# Patient Record
Sex: Female | Born: 1995 | Race: Black or African American | Hispanic: No | Marital: Single | Smoking: Never smoker
Health system: Southern US, Community
[De-identification: ages and names within clinical notes are randomized; demographics above are authoritative.]

## PROBLEM LIST (undated history)

## (undated) HISTORY — PX: WISDOM TOOTH EXTRACTION: SHX21

## (undated) HISTORY — PX: TONSILLECTOMY: SUR1361

---
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
## 2016-04-05 ENCOUNTER — Emergency Department (HOSPITAL_BASED_OUTPATIENT_CLINIC_OR_DEPARTMENT_OTHER): Payer: Self-pay

## 2016-04-05 ENCOUNTER — Emergency Department (HOSPITAL_BASED_OUTPATIENT_CLINIC_OR_DEPARTMENT_OTHER)
Admission: EM | Admit: 2016-04-05 | Discharge: 2016-04-05 | Disposition: A | Discharge status: Home Health | Payer: Self-pay | Attending: Emergency Medicine | Admitting: Emergency Medicine

## 2016-04-05 ENCOUNTER — Encounter (HOSPITAL_BASED_OUTPATIENT_CLINIC_OR_DEPARTMENT_OTHER): Payer: Self-pay | Admitting: Emergency Medicine

## 2016-04-05 DIAGNOSIS — R51 Headache: Secondary | ICD-10-CM | POA: Insufficient documentation

## 2016-04-05 DIAGNOSIS — Z791 Long term (current) use of non-steroidal anti-inflammatories (NSAID): Secondary | ICD-10-CM | POA: Insufficient documentation

## 2016-04-05 DIAGNOSIS — R519 Headache, unspecified: Secondary | ICD-10-CM

## 2016-04-05 DIAGNOSIS — R55 Syncope and collapse: Secondary | ICD-10-CM | POA: Insufficient documentation

## 2016-04-05 LAB — CBC WITH DIFFERENTIAL/PLATELET
Basophils Absolute: 0 10*3/uL (ref 0.0–0.1)
Basophils Relative: 0 %
EOS ABS: 0.5 10*3/uL (ref 0.0–0.7)
EOS PCT: 4 %
HCT: 39.7 % (ref 36.0–46.0)
Hemoglobin: 13.5 g/dL (ref 12.0–15.0)
LYMPHS ABS: 3.6 10*3/uL (ref 0.7–4.0)
Lymphocytes Relative: 29 %
MCH: 30.3 pg (ref 26.0–34.0)
MCHC: 34 g/dL (ref 30.0–36.0)
MCV: 89.2 fL (ref 78.0–100.0)
MONO ABS: 1.1 10*3/uL — AB (ref 0.1–1.0)
Monocytes Relative: 9 %
Neutro Abs: 7.3 10*3/uL (ref 1.7–7.7)
Neutrophils Relative %: 58 %
PLATELETS: 252 10*3/uL (ref 150–400)
RBC: 4.45 MIL/uL (ref 3.87–5.11)
RDW: 12.5 % (ref 11.5–15.5)
WBC: 12.5 10*3/uL — AB (ref 4.0–10.5)

## 2016-04-05 LAB — URINALYSIS, MICROSCOPIC (REFLEX): RBC / HPF: NONE SEEN RBC/hpf (ref 0–5)

## 2016-04-05 LAB — URINALYSIS, ROUTINE W REFLEX MICROSCOPIC
BILIRUBIN URINE: NEGATIVE
GLUCOSE, UA: NEGATIVE mg/dL
HGB URINE DIPSTICK: NEGATIVE
KETONES UR: NEGATIVE mg/dL
Leukocytes, UA: NEGATIVE
Nitrite: NEGATIVE
PROTEIN: 30 mg/dL — AB
Specific Gravity, Urine: 1.012 (ref 1.005–1.030)
pH: 7 (ref 5.0–8.0)

## 2016-04-05 LAB — PREGNANCY, URINE: PREG TEST UR: NEGATIVE

## 2016-04-05 LAB — BASIC METABOLIC PANEL
Anion gap: 10 (ref 5–15)
BUN: 14 mg/dL (ref 6–20)
CALCIUM: 9.5 mg/dL (ref 8.9–10.3)
CO2: 25 mmol/L (ref 22–32)
CREATININE: 0.88 mg/dL (ref 0.44–1.00)
Chloride: 103 mmol/L (ref 101–111)
GFR calc Af Amer: >60 mL/min (ref >60)
GLUCOSE: 94 mg/dL (ref 65–99)
POTASSIUM: 3.1 mmol/L — AB (ref 3.5–5.1)
SODIUM: 138 mmol/L (ref 135–145)

## 2016-04-05 MED ORDER — SODIUM CHLORIDE 0.9 % IV BOLUS (SEPSIS)
1000.0000 mL | Freq: Once | INTRAVENOUS | Status: AC
  Administered 2016-04-05: 1000 mL via INTRAVENOUS

## 2016-04-05 NOTE — ED Triage Notes (Signed)
Patient c/o "migraine" for weeks. Patient c/o dizziness with this. Patient taking ibuprofen 800mg  without relief, last taken "the other day". Patient is ambulatory in triage, NAD noted.

## 2016-04-05 NOTE — ED Notes (Signed)
Patient transported to CT 

## 2016-04-05 NOTE — ED Notes (Signed)
Pt on auto VS and continuous pulse ox as well as cardiac monitoring.

## 2016-04-05 NOTE — ED Provider Notes (Signed)
[facility]   CSN: [id number] Arrival date & time: 04/05/16  1426     History   Chief Complaint Chief Complaint  Patient presents with  . Migraine    with dizziness    HPI Kristie Price is a 20 y.o. female.  Pt presents to the ED today with dizziness and migraine.  She said that she passed out this morning when she got out of the shower.  The pt is due for new glasses and thought the headaches were due to needing new glasses, but when she passed out today, she became worried.      History reviewed. No pertinent past medical history.  There are no active problems to display for this patient.   Past Surgical History:  Procedure Laterality Date  . TONSILLECTOMY    . WISDOM TOOTH EXTRACTION      OB History    No data available       Home Medications    Prior to Admission medications   Medication Sig Start Date End Date Taking? Authorizing Provider  ibuprofen (ADVIL,MOTRIN) 600 MG tablet Take 1 tablet (600 mg total) by mouth every 8 (eight) hours as needed for fever, mild pain or moderate pain. 09/14/15  Yes Trixie DredgeEmily West, PA-C  guaiFENesin-dextromethorphan (ROBITUSSIN DM) 100-10 MG/5ML syrup Take 5 mLs by mouth every 4 (four) hours as needed for cough. 09/14/15   Trixie DredgeEmily West, PA-C  HYDROcodone-acetaminophen (NORCO) 5-325 MG per tablet Take 1-2 tablets by mouth every 6 (six) hours as needed (for pain). 06/26/14   John Molpus, MD  metroNIDAZOLE (FLAGYL) 500 MG tablet Take 500 mg by mouth 3 (three) times daily.    Historical Provider, MD  naproxen (NAPROSYN) 500 MG tablet Take 1 tablet (500 mg total) by mouth 2 (two) times daily. 03/22/15   Tiffany Neva SeatGreene, PA-C  ondansetron (ZOFRAN) 4 MG tablet Take 1 tablet (4 mg total) by mouth every 6 (six) hours. 07/14/15   Eyvonne MechanicJeffrey Hedges, PA-C  pseudoephedrine (SUDAFED) 60 MG tablet Take 1 tablet (60 mg total) by mouth every 6 (six) hours as needed for congestion. 09/14/15   Trixie DredgeEmily West, PA-C    Family  History History reviewed. No pertinent family history.  Social History Social History  Substance Use Topics  . Smoking status: Never Smoker  . Smokeless tobacco: Never Used  . Alcohol use No     Allergies   Patient has no known allergies.   Review of Systems Review of Systems  Neurological: Positive for dizziness and headaches.  All other systems reviewed and are negative.    Physical Exam Updated Vital Signs BP 145/72 (BP Location: Right Arm)   Pulse 106   Temp 97.9 F (36.6 C) (Oral)   Resp 18   Ht 5\' 3"  (1.6 m)   Wt 206 lb 12.8 oz (93.8 kg)   LMP 03/22/2016 (Approximate)   SpO2 100%   BMI 36.63 kg/m   Physical Exam  Constitutional: She is oriented to person, place, and time. She appears well-developed and well-nourished.  HENT:  Head: Normocephalic and atraumatic.  Right Ear: External ear normal.  Left Ear: External ear normal.  Nose: Nose normal.  Mouth/Throat: Oropharynx is clear and moist.  Eyes: Conjunctivae and EOM are normal. Pupils are equal, round, and reactive to light.  Neck: Normal range of motion. Neck supple.  Cardiovascular: Normal rate, regular rhythm, normal heart sounds and intact distal pulses.   Pulmonary/Chest: Effort normal and breath sounds normal.  Abdominal: Soft. Bowel sounds are  normal.  Musculoskeletal: Normal range of motion.  Neurological: She is alert and oriented to person, place, and time.  Skin: Skin is warm.  Psychiatric: She has a normal mood and affect. Her behavior is normal. Judgment and thought content normal.  Nursing note and vitals reviewed.    ED Treatments / Results  Labs (all labs ordered are listed, but only abnormal results are displayed) Labs Reviewed  CBC WITH DIFFERENTIAL/PLATELET - Abnormal; Notable for the following:       Result Value   WBC 12.5 (*)    Monocytes Absolute 1.1 (*)    All other components within normal limits  BASIC METABOLIC PANEL - Abnormal; Notable for the following:     Potassium 3.1 (*)    All other components within normal limits  URINALYSIS, ROUTINE W REFLEX MICROSCOPIC - Abnormal; Notable for the following:    Protein, ur 30 (*)    All other components within normal limits  URINALYSIS, MICROSCOPIC (REFLEX) - Abnormal; Notable for the following:    Bacteria, UA RARE (*)    Squamous Epithelial / LPF 0-5 (*)    All other components within normal limits  PREGNANCY, URINE    EKG  EKG Interpretation  Date/Time:  Sunday April 05 2016 15:21:30 EST Ventricular Rate:  100 PR Interval:    QRS Duration: 88 QT Interval:  327 QTC Calculation: 422 R Axis:   92 Text Interpretation:  Sinus tachycardia Atrial premature complexes Borderline right axis deviation Borderline repolarization abnormality Baseline wander in lead(s) II III aVL aVF Confirmed by Nivea Wojdyla MD, Zadok Holaway ([id number]) on 04/05/2016 3:27:48 PM       Radiology Ct Head Wo Contrast  Result Date: 04/05/2016 CLINICAL DATA:  Migraine headache for the past week. Dizziness after getting out of the shower today. EXAM: CT HEAD WITHOUT CONTRAST TECHNIQUE: Contiguous axial images were obtained from the base of the skull through the vertex without intravenous contrast. COMPARISON:  None. FINDINGS: Brain: No evidence of acute infarction, hemorrhage, hydrocephalus, extra-axial collection or mass lesion/mass effect. Vascular: No hyperdense vessel or unexpected calcification. Skull: Normal. Negative for fracture or focal lesion. Sinuses/Orbits: No acute finding. Other: None. IMPRESSION: Normal examination. Electronically Signed   By: Beckie SaltsSteven  Reid M.D.   On: 04/05/2016 15:27    Procedures Procedures (including critical care time)  Medications Ordered in ED Medications  sodium chloride 0.9 % bolus 1,000 mL (1,000 mLs Intravenous New Bag/Given 04/05/16 1545)     Initial Impression / Assessment and Plan / ED Course  I have reviewed the triage vital signs and the nursing notes.  Pertinent labs & imaging  results that were available during my care of the patient were reviewed by me and considered in my medical decision making (see chart for details).  Clinical Course    Pt is feeling better.  Pt's headaches likely due to visual impairment.  She is encouraged to f/u with her eye doctor.  Pt knows to return if worse.  Final Clinical Impressions(s) / ED Diagnoses   Final diagnoses:  Acute nonintractable headache, unspecified headache type  Syncope, unspecified syncope type    New Prescriptions New Prescriptions   No medications on file     Jacalyn LefevreJulie Lina Hitch, MD 04/05/16 1609

## 2016-05-13 ENCOUNTER — Emergency Department (HOSPITAL_BASED_OUTPATIENT_CLINIC_OR_DEPARTMENT_OTHER)
Admission: EM | Admit: 2016-05-13 | Discharge: 2016-05-13 | Disposition: A | Discharge status: Home / Self Care | Payer: Self-pay | Attending: Emergency Medicine | Admitting: Emergency Medicine

## 2016-05-13 ENCOUNTER — Encounter (HOSPITAL_BASED_OUTPATIENT_CLINIC_OR_DEPARTMENT_OTHER): Payer: Self-pay

## 2016-05-13 DIAGNOSIS — J069 Acute upper respiratory infection, unspecified: Secondary | ICD-10-CM | POA: Insufficient documentation

## 2016-05-13 MED ORDER — ONDANSETRON HCL 4 MG PO TABS: 4.0000 mg | ORAL_TABLET | Freq: Three times a day (TID) | ORAL | 0 refills | Status: DC | PRN

## 2016-05-13 MED ORDER — IPRATROPIUM-ALBUTEROL 0.5-2.5 (3) MG/3ML IN SOLN
3.0000 mL | Freq: Once | RESPIRATORY_TRACT | Status: AC
  Administered 2016-05-13: 3 mL via RESPIRATORY_TRACT
  Filled 2016-05-13: qty 3

## 2016-05-13 MED ORDER — ONDANSETRON HCL 8 MG PO TABS: 4.0000 mg | ORAL_TABLET | Freq: Once | ORAL | Status: DC

## 2016-05-13 MED ORDER — ONDANSETRON 4 MG PO TBDP
ORAL_TABLET | ORAL | Status: AC
  Administered 2016-05-13: 4 mg via ORAL
  Filled 2016-05-13: qty 1

## 2016-05-13 MED ORDER — ONDANSETRON 4 MG PO TBDP
4.0000 mg | ORAL_TABLET | Freq: Once | ORAL | Status: AC
  Administered 2016-05-13: 4 mg via ORAL

## 2016-05-13 NOTE — ED Notes (Signed)
ED Provider at bedside. 

## 2016-05-13 NOTE — ED Triage Notes (Signed)
C/o fever, vomited x 1, head and chest congestion, prod cough-NAD-steady gait

## 2016-05-13 NOTE — ED Provider Notes (Signed)
[facility]   CSN: [id number] Arrival date & time: 05/13/16  1039     History   Chief Complaint Chief Complaint  Patient presents with  . Cough    HPI Kristie Price is a 21 y.o. female otherwise healthy presenting with 1 day of productive coughing from postnasal drip. Reported a subjective fever yesterday she states that she feels dehydrated and weak and vomited water this morning he hasn't had any food today. Mom gave her Robitussin without relief. She is now experiencing generalized body aches today. Denies diarrhea, dysuria, hematuria, abdominal pain, shortness of breath, or chest pain.  HPI  History reviewed. No pertinent past medical history.  There are no active problems to display for this patient.   Past Surgical History:  Procedure Laterality Date  . TONSILLECTOMY    . WISDOM TOOTH EXTRACTION      OB History    No data available       Home Medications    Prior to Admission medications   Medication Sig Start Date End Date Taking? Authorizing Provider  ondansetron (ZOFRAN) 4 MG tablet Take 1 tablet (4 mg total) by mouth every 8 (eight) hours as needed for nausea or vomiting. 05/13/16   Georgiana Shore, PA-C    Family History No family history on file.  Social History Social History  Substance Use Topics  . Smoking status: Never Smoker  . Smokeless tobacco: Never Used  . Alcohol use No     Allergies   Patient has no known allergies.   Review of Systems Review of Systems  Constitutional: Positive for fever. Negative for chills.       Subjective fever at home  HENT: Positive for congestion, postnasal drip and sore throat. Negative for ear pain, sinus pain and sinus pressure.   Eyes: Negative for pain and visual disturbance.  Respiratory: Positive for cough. Negative for choking, chest tightness, shortness of breath, wheezing and stridor.   Cardiovascular: Negative for chest pain and palpitations.  Gastrointestinal:  Positive for nausea and vomiting. Negative for abdominal distention, abdominal pain, blood in stool and diarrhea.  Genitourinary: Negative for dysuria and hematuria.  Musculoskeletal: Positive for myalgias. Negative for arthralgias, back pain, gait problem, neck pain and neck stiffness.  Skin: Negative for color change, pallor and rash.  Neurological: Negative for dizziness, seizures, syncope, weakness and light-headedness.  All other systems reviewed and are negative.    Physical Exam Updated Vital Signs BP 93/67 (BP Location: Left Arm)   Pulse 111   Temp 99.5 F (37.5 C) (Oral)   Resp 18   Ht 5\' 3"  (1.6 m)   Wt 91.2 kg   LMP 05/09/2016   SpO2 100%   BMI 35.61 kg/m   Physical Exam  Constitutional: She appears well-developed and well-nourished. No distress.  Afebrile, nontoxic appearing lying comfortably in bed in no acute distress.  HENT:  Head: Normocephalic and atraumatic.  Mouth/Throat: Oropharynx is clear and moist.  Eyes: Conjunctivae and EOM are normal.  Neck: Normal range of motion. Neck supple.  Cardiovascular: Normal rate, regular rhythm and normal heart sounds.   No murmur heard. Pulmonary/Chest: Effort normal. No respiratory distress. She has wheezes. She has no rales. She exhibits no tenderness.  Wheezing appreciated in left lower lobe  Abdominal: Soft. She exhibits no distension. There is no tenderness. There is no guarding.  Musculoskeletal: She exhibits no edema.  Neurological: She is alert.  Skin: Skin is warm and dry. She is not diaphoretic. No  pallor.  Psychiatric: She has a normal mood and affect.  Nursing note and vitals reviewed.    ED Treatments / Results  Labs (all labs ordered are listed, but only abnormal results are displayed) Labs Reviewed - No data to display  EKG  EKG Interpretation None       Radiology No results found.  Procedures Procedures (including critical care time)  Medications Ordered in ED Medications    ipratropium-albuterol (DUONEB) 0.5-2.5 (3) MG/3ML nebulizer solution 3 mL (3 mLs Nebulization Given by Other 05/13/16 1335)  ondansetron (ZOFRAN-ODT) disintegrating tablet 4 mg (4 mg Oral Given 05/13/16 1340)     Initial Impression / Assessment and Plan / ED Course  I have reviewed the triage vital signs and the nursing notes.  Pertinent labs & imaging results that were available during my care of the patient were reviewed by me and considered in my medical decision making (see chart for details).    Otherwise healthy 21 y/o female presenting with flu-like upper respiratory symptoms for one day. On exam, noted mild left lower lobe wheezing, given duonebs. Patient complained of nausea and was given zofran.  She asked what would be done about her dehydration, Encouraged oral rehydration and patient was tolerating PO well. She had a cup of water and ginger ale while in the Ed. Oral mucous membranes moist and no signs of dehydration noted on exam.   Patient reassessed after duonebs and zofran and her lungs were clear and equal bilaterally and she was drinking and overall felt better.  Discharge home with symptomatic relief of nausea and close PCP follow up.   Discussed strict return precautions. Patient was advised to return to the emergency department if experiencing new or worsening symptoms. she understood instructions and agreed with discharge plan.  Final Clinical Impressions(s) / ED Diagnoses   Final diagnoses:  Acute upper respiratory infection    New Prescriptions Discharge Medication List as of 05/13/2016  3:11 PM    START taking these medications   Details  ondansetron (ZOFRAN) 4 MG tablet Take 1 tablet (4 mg total) by mouth every 8 (eight) hours as needed for nausea or vomiting., Starting Wed 05/13/2016, Print         Georgiana ShoreJessica B Andreka Stucki, PA-C 05/14/16 1840    Jerelyn ScottMartha Linker, MD 05/19/16 1511

## 2016-07-31 ENCOUNTER — Emergency Department (HOSPITAL_BASED_OUTPATIENT_CLINIC_OR_DEPARTMENT_OTHER)
Admission: EM | Admit: 2016-07-31 | Discharge: 2016-07-31 | Disposition: A | Discharge status: Home / Self Care | Payer: Medicaid Other | Attending: Emergency Medicine | Admitting: Emergency Medicine

## 2016-07-31 ENCOUNTER — Encounter (HOSPITAL_BASED_OUTPATIENT_CLINIC_OR_DEPARTMENT_OTHER): Payer: Self-pay | Admitting: Emergency Medicine

## 2016-07-31 DIAGNOSIS — R102 Pelvic and perineal pain: Secondary | ICD-10-CM | POA: Insufficient documentation

## 2016-07-31 DIAGNOSIS — R194 Change in bowel habit: Secondary | ICD-10-CM | POA: Insufficient documentation

## 2016-07-31 DIAGNOSIS — R195 Other fecal abnormalities: Secondary | ICD-10-CM

## 2016-07-31 LAB — URINALYSIS, ROUTINE W REFLEX MICROSCOPIC
BILIRUBIN URINE: NEGATIVE
GLUCOSE, UA: NEGATIVE mg/dL
HGB URINE DIPSTICK: NEGATIVE
KETONES UR: NEGATIVE mg/dL
Leukocytes, UA: NEGATIVE
Nitrite: NEGATIVE
PROTEIN: NEGATIVE mg/dL
Specific Gravity, Urine: 1.013 (ref 1.005–1.030)
pH: 6 (ref 5.0–8.0)

## 2016-07-31 LAB — PREGNANCY, URINE: PREG TEST UR: NEGATIVE

## 2016-07-31 NOTE — ED Provider Notes (Signed)
[facility]   CSN: [id number] Arrival date & time: 07/31/16  1218     History   Chief Complaint Chief Complaint  Patient presents with  . Pelvic Pain    HPI Kristie Price is a 21 y.o. female.  HPI   Last week had period but lasted only 3 days, was heavy bleeding, had heavy cramps as well, midol and ibuprofen works usually but this time didn't.  Had BM today which looked like "pieces of meat", Last week had severe abdominal pain, minor throughout the day but would have episodes.  Now has minor pain.  Rectal pain.  At first had diarrhea but then was straining.  Maybe pink colored.  Can't recall food dye in food. Patient reports she went on google and became concerned that change in stool habits could represent colon cancer.    History reviewed. No pertinent past medical history.  There are no active problems to display for this patient.   Past Surgical History:  Procedure Laterality Date  . TONSILLECTOMY    . WISDOM TOOTH EXTRACTION      OB History    No data available       Home Medications    Prior to Admission medications   Medication Sig Start Date End Date Taking? Authorizing Provider  ondansetron (ZOFRAN) 4 MG tablet Take 1 tablet (4 mg total) by mouth every 8 (eight) hours as needed for nausea or vomiting. 05/13/16   Georgiana Shore, PA-C    Family History History reviewed. No pertinent family history.  Social History Social History  Substance Use Topics  . Smoking status: Never Smoker  . Smokeless tobacco: Never Used  . Alcohol use No     Allergies   Patient has no known allergies.   Review of Systems Review of Systems  Constitutional: Negative for fever.  HENT: Negative for sore throat.   Eyes: Negative for visual disturbance.  Respiratory: Negative for cough and shortness of breath.   Cardiovascular: Negative for chest pain.  Gastrointestinal: Positive for abdominal pain. Negative for diarrhea, nausea and  vomiting.  Genitourinary: Negative for difficulty urinating, vaginal bleeding (last week) and vaginal discharge.  Musculoskeletal: Negative for back pain and neck pain.  Skin: Negative for rash.  Neurological: Negative for syncope and headaches.     Physical Exam Updated Vital Signs BP (!) 145/81 (BP Location: Right Arm)   Pulse (!) 102   Temp 99.1 F (37.3 C) (Oral)   Resp 18   Ht  (1.6 m)   Wt 201 lb (91.2 kg)   LMP 07/28/2016   SpO2 100%   BMI 35.61 kg/m   Physical Exam  Constitutional: She is oriented to person, place, and time. She appears well-developed and well-nourished. No distress.  HENT:  Head: Normocephalic and atraumatic.  Eyes: Conjunctivae and EOM are normal.  Neck: Normal range of motion.  Cardiovascular: Normal rate, regular rhythm, normal heart sounds and intact distal pulses.  Exam reveals no gallop and no friction rub.   No murmur heard. Pulmonary/Chest: Effort normal and breath sounds normal. No respiratory distress. She has no wheezes. She has no rales.  Abdominal: Soft. She exhibits no distension. There is tenderness (reports mild suprapubic discomfort). There is no guarding.  Genitourinary:  Genitourinary Comments: Small skin tag on rectal exam, possible prior hemorrhoid  Musculoskeletal: She exhibits no edema or tenderness.  Neurological: She is alert and oriented to person, place, and time.  Skin: Skin is warm and dry. No  rash noted. She is not diaphoretic. No erythema.  Nursing note and vitals reviewed.    ED Treatments / Results  Labs (all labs ordered are listed, but only abnormal results are displayed) Labs Reviewed  URINALYSIS, ROUTINE W REFLEX MICROSCOPIC  PREGNANCY, URINE    EKG  EKG Interpretation None       Radiology No results found.  Procedures Procedures (including critical care time)  Medications Ordered in ED Medications - No data to display   Initial Impression / Assessment and Plan / ED Course  I have  reviewed the triage vital signs and the nursing notes.  Pertinent labs & imaging results that were available during my care of the patient were reviewed by me and considered in my medical decision making (see chart for details).    21 year old female presents with concern for abnormal period last week, abnormal stool this morning, and abdominal cramping.  Pregnancy test negative. Urinalysis shows no sign of infection. Stool describe a patient does not sound concerning for acute GI bleed or melena. Discussed with patient that abnormal stool. This may be secondary to diet, and recommended outpatient follow-up if she continues to have a change in her stool habits. For her lower abdominal pain, have low suspicion for diverticulitis, appendicitis, given history, exam, no fevers, no focal tenderness. History is not consistent with ovarian torsion. Discussed possibility of doing pelvic exam, however patient declines and reports recent STD testing, no vaginal discharge or pruritus, and have low suspicion for acute pelvic pathology.  Patient is stable for outpatient follow-up. Patient discharged in stable condition with understanding of reasons to return.    Final Clinical Impressions(s) / ED Diagnoses   Final diagnoses:  Pelvic pain in female  Change in consistency of stool    New Prescriptions Discharge Medication List as of 07/31/2016  1:32 PM       Alvira Monday, MD 07/31/16 1635

## 2016-07-31 NOTE — ED Triage Notes (Signed)
Patient states that she has had pain to her mid pelvic region up into her abdominal area. She reports that it is cramping sensation. Denies any N/V/D - reports a change in her stool pattern. The patient reports that her last period was shorter and heavier than usual

## 2016-12-01 ENCOUNTER — Emergency Department (HOSPITAL_BASED_OUTPATIENT_CLINIC_OR_DEPARTMENT_OTHER)
Admission: EM | Admit: 2016-12-01 | Discharge: 2016-12-01 | Disposition: A | Discharge status: Home / Self Care | Payer: Medicaid Other | Attending: Emergency Medicine | Admitting: Emergency Medicine

## 2016-12-01 ENCOUNTER — Encounter (HOSPITAL_BASED_OUTPATIENT_CLINIC_OR_DEPARTMENT_OTHER): Payer: Self-pay | Admitting: *Deleted

## 2016-12-01 DIAGNOSIS — B9689 Other specified bacterial agents as the cause of diseases classified elsewhere: Secondary | ICD-10-CM

## 2016-12-01 DIAGNOSIS — N898 Other specified noninflammatory disorders of vagina: Secondary | ICD-10-CM

## 2016-12-01 DIAGNOSIS — N760 Acute vaginitis: Secondary | ICD-10-CM | POA: Insufficient documentation

## 2016-12-01 LAB — PREGNANCY, URINE: PREG TEST UR: NEGATIVE

## 2016-12-01 LAB — URINALYSIS, ROUTINE W REFLEX MICROSCOPIC
BILIRUBIN URINE: NEGATIVE
Glucose, UA: NEGATIVE mg/dL
HGB URINE DIPSTICK: NEGATIVE
KETONES UR: NEGATIVE mg/dL
NITRITE: NEGATIVE
PROTEIN: NEGATIVE mg/dL
SPECIFIC GRAVITY, URINE: 1.028 (ref 1.005–1.030)
pH: 6 (ref 5.0–8.0)

## 2016-12-01 LAB — WET PREP, GENITAL
SPERM: NONE SEEN
Trich, Wet Prep: NONE SEEN

## 2016-12-01 LAB — URINALYSIS, MICROSCOPIC (REFLEX)

## 2016-12-01 MED ORDER — LIDOCAINE HCL 1 % IJ SOLN
INTRAMUSCULAR | Status: AC
  Filled 2016-12-01: qty 10

## 2016-12-01 MED ORDER — METRONIDAZOLE 500 MG PO TABS: 500.0000 mg | ORAL_TABLET | Freq: Two times a day (BID) | ORAL | 0 refills | Status: DC

## 2016-12-01 MED ORDER — AZITHROMYCIN 250 MG PO TABS
1000.0000 mg | ORAL_TABLET | Freq: Once | ORAL | Status: AC
  Administered 2016-12-01: 1000 mg via ORAL
  Filled 2016-12-01: qty 4

## 2016-12-01 MED ORDER — FLUCONAZOLE 150 MG PO TABS: 150.0000 mg | ORAL_TABLET | Freq: Once | ORAL | 0 refills | Status: AC

## 2016-12-01 MED ORDER — CEFTRIAXONE SODIUM 250 MG IJ SOLR
250.0000 mg | Freq: Once | INTRAMUSCULAR | Status: AC
  Administered 2016-12-01: 250 mg via INTRAMUSCULAR
  Filled 2016-12-01: qty 250

## 2016-12-01 NOTE — ED Triage Notes (Signed)
Pt reports vaginal itching since yesterday, used OTC med for it without relief. She says she is having a "pinkish" discharge and burning with urination.

## 2016-12-01 NOTE — ED Provider Notes (Signed)
[facility]   CSN: [id number] Arrival date & time: 12/01/16  2047     History   Chief Complaint Chief Complaint  Patient presents with  . Vaginal Discharge    HPI Kristie Price is a 21 y.o. female.  Patient is a 21 year old female with no significant past medical history. She presents with complaints of vaginal discharge for the past 2 days. She denies any fevers or chills. She does report sexual contact with one new partner, however he denies any symptoms. Last menstrual period was 2 weeks ago and normal.   The history is provided by the patient.  Vaginal Discharge   This is a new problem. The current episode started 2 days ago. The problem occurs constantly. The problem has been rapidly worsening. The discharge occurs spontaneously. The discharge was brown. Associated symptoms include genital itching. Pertinent negatives include no fever, no nausea, no vomiting and no genital lesions. She has tried nothing for the symptoms.    History reviewed. No pertinent past medical history.  There are no active problems to display for this patient.   Past Surgical History:  Procedure Laterality Date  . TONSILLECTOMY    . WISDOM TOOTH EXTRACTION      OB History    No data available       Home Medications    Prior to Admission medications   Medication Sig Start Date End Date Taking? Authorizing Provider  ondansetron (ZOFRAN) 4 MG tablet Take 1 tablet (4 mg total) by mouth every 8 (eight) hours as needed for nausea or vomiting. 05/13/16   Georgiana Shore, PA-C    Family History No family history on file.  Social History Social History  Substance Use Topics  . Smoking status: Never Smoker  . Smokeless tobacco: Never Used  . Alcohol use No     Allergies   Patient has no known allergies.   Review of Systems Review of Systems  Constitutional: Negative for fever.  Gastrointestinal: Negative for nausea and vomiting.  Genitourinary:  Positive for vaginal discharge.  All other systems reviewed and are negative.    Physical Exam Updated Vital Signs BP (!) 144/85   Pulse 86   Temp 99.7 F (37.6 C) (Oral)   Resp 18   Ht 5\' 3"  (1.6 m)   Wt 93 kg (205 lb)   LMP 11/24/2016   SpO2 99%   BMI 36.31 kg/m   Physical Exam  Constitutional: She is oriented to person, place, and time. She appears well-developed and well-nourished. No distress.  HENT:  Head: Normocephalic and atraumatic.  Neck: Normal range of motion. Neck supple.  Cardiovascular: Normal rate and regular rhythm.  Exam reveals no gallop and no friction rub.   No murmur heard. Pulmonary/Chest: Effort normal and breath sounds normal. No respiratory distress. She has no wheezes.  Abdominal: Soft. Bowel sounds are normal. She exhibits no distension. There is no tenderness.  Genitourinary: Vaginal discharge found.  Genitourinary Comments: There is a brownish discharge present. There is tenderness at the introitus, however no obvious lesions or erythema.  Musculoskeletal: Normal range of motion.  Neurological: She is alert and oriented to person, place, and time.  Skin: Skin is warm and dry. She is not diaphoretic.  Nursing note and vitals reviewed.    ED Treatments / Results  Labs (all labs ordered are listed, but only abnormal results are displayed) Labs Reviewed  URINALYSIS, ROUTINE W REFLEX MICROSCOPIC - Abnormal; Notable for the following:  Result Value   Leukocytes, UA SMALL (*)    All other components within normal limits  URINALYSIS, MICROSCOPIC (REFLEX) - Abnormal; Notable for the following:    Bacteria, UA FEW (*)    Squamous Epithelial / LPF 0-5 (*)    All other components within normal limits  PREGNANCY, URINE    EKG  EKG Interpretation None       Radiology No results found.  Procedures Procedures (including critical care time)  Medications Ordered in ED Medications - No data to display   Initial Impression /  Assessment and Plan / ED Course  I have reviewed the triage vital signs and the nursing notes.  Pertinent labs & imaging results that were available during my care of the patient were reviewed by me and considered in my medical decision making (see chart for details).  Patient presents with complaints of brownish vaginal discharge that has worsened over the past 2 days. She has a new sexual partner, however he is asymptomatic. Her wet prep today reveals many WBCs but also shows clue cells and yeast cells.  GC and Chlamydia tests are pending. She will be given Rocephin, Zithromax, and discharged with Flagyl and Diflucan.  Final Clinical Impressions(s) / ED Diagnoses   Final diagnoses:  None    New Prescriptions New Prescriptions   No medications on file     Geoffery Lyonselo, Chrisanna Mishra, MD 12/01/16 2249

## 2016-12-01 NOTE — Discharge Instructions (Signed)
Flagyl as prescribed.  Diflucan as prescribed upon completion of your Flagyl.  We will call you if your cultures indicate you require further treatment or action.

## 2016-12-03 LAB — GC/CHLAMYDIA PROBE AMP (~~LOC~~) NOT AT ARMC
Chlamydia: NEGATIVE
Neisseria Gonorrhea: NEGATIVE

## 2017-04-28 ENCOUNTER — Emergency Department (HOSPITAL_BASED_OUTPATIENT_CLINIC_OR_DEPARTMENT_OTHER)
Admission: EM | Admit: 2017-04-28 | Discharge: 2017-04-28 | Disposition: A | Discharge status: Home / Self Care | Payer: Self-pay | Attending: Emergency Medicine | Admitting: Emergency Medicine

## 2017-04-28 ENCOUNTER — Other Ambulatory Visit: Payer: Self-pay

## 2017-04-28 ENCOUNTER — Encounter (HOSPITAL_BASED_OUTPATIENT_CLINIC_OR_DEPARTMENT_OTHER): Payer: Self-pay | Admitting: Emergency Medicine

## 2017-04-28 DIAGNOSIS — R197 Diarrhea, unspecified: Secondary | ICD-10-CM | POA: Insufficient documentation

## 2017-04-28 DIAGNOSIS — R112 Nausea with vomiting, unspecified: Secondary | ICD-10-CM | POA: Insufficient documentation

## 2017-04-28 DIAGNOSIS — R102 Pelvic and perineal pain: Secondary | ICD-10-CM | POA: Insufficient documentation

## 2017-04-28 LAB — URINALYSIS, MICROSCOPIC (REFLEX)

## 2017-04-28 LAB — CBC WITH DIFFERENTIAL/PLATELET
BASOS ABS: 0 10*3/uL (ref 0.0–0.1)
BASOS PCT: 0 %
EOS ABS: 0.1 10*3/uL (ref 0.0–0.7)
Eosinophils Relative: 1 %
HCT: 35.5 % — ABNORMAL LOW (ref 36.0–46.0)
HEMOGLOBIN: 12.1 g/dL (ref 12.0–15.0)
Lymphocytes Relative: 12 %
Lymphs Abs: 1.3 10*3/uL (ref 0.7–4.0)
MCH: 30 pg (ref 26.0–34.0)
MCHC: 34.1 g/dL (ref 30.0–36.0)
MCV: 88.1 fL (ref 78.0–100.0)
MONOS PCT: 9 %
Monocytes Absolute: 1 10*3/uL (ref 0.1–1.0)
Neutro Abs: 8.5 10*3/uL — ABNORMAL HIGH (ref 1.7–7.7)
Neutrophils Relative %: 78 %
Platelets: 214 10*3/uL (ref 150–400)
RBC: 4.03 MIL/uL (ref 3.87–5.11)
RDW: 12.4 % (ref 11.5–15.5)
WBC: 11 10*3/uL — ABNORMAL HIGH (ref 4.0–10.5)

## 2017-04-28 LAB — COMPREHENSIVE METABOLIC PANEL
ALBUMIN: 3.7 g/dL (ref 3.5–5.0)
ALK PHOS: 56 U/L (ref 38–126)
ALT: 20 U/L (ref 14–54)
ANION GAP: 6 (ref 5–15)
AST: 20 U/L (ref 15–41)
BILIRUBIN TOTAL: 0.5 mg/dL (ref 0.3–1.2)
BUN: 15 mg/dL (ref 6–20)
CALCIUM: 8.5 mg/dL — AB (ref 8.9–10.3)
CO2: 25 mmol/L (ref 22–32)
Chloride: 106 mmol/L (ref 101–111)
Creatinine, Ser: 0.85 mg/dL (ref 0.44–1.00)
GLUCOSE: 106 mg/dL — AB (ref 65–99)
Potassium: 3.8 mmol/L (ref 3.5–5.1)
Sodium: 137 mmol/L (ref 135–145)
TOTAL PROTEIN: 6.6 g/dL (ref 6.5–8.1)

## 2017-04-28 LAB — URINALYSIS, ROUTINE W REFLEX MICROSCOPIC
BILIRUBIN URINE: NEGATIVE
Glucose, UA: NEGATIVE mg/dL
Ketones, ur: NEGATIVE mg/dL
LEUKOCYTES UA: NEGATIVE
NITRITE: NEGATIVE
Protein, ur: NEGATIVE mg/dL
SPECIFIC GRAVITY, URINE: 1.02 (ref 1.005–1.030)
pH: 6.5 (ref 5.0–8.0)

## 2017-04-28 LAB — PREGNANCY, URINE: PREG TEST UR: NEGATIVE

## 2017-04-28 LAB — LIPASE, BLOOD: LIPASE: 21 U/L (ref 11–51)

## 2017-04-28 MED ORDER — ONDANSETRON HCL 4 MG/2ML IJ SOLN
4.0000 mg | Freq: Once | INTRAMUSCULAR | Status: AC
  Administered 2017-04-28: 4 mg via INTRAVENOUS
  Filled 2017-04-28: qty 2

## 2017-04-28 MED ORDER — LOPERAMIDE HCL 2 MG PO CAPS
4.0000 mg | ORAL_CAPSULE | Freq: Once | ORAL | Status: AC
  Administered 2017-04-28: 4 mg via ORAL
  Filled 2017-04-28: qty 2

## 2017-04-28 MED ORDER — ONDANSETRON HCL 4 MG PO TABS: 4.0000 mg | ORAL_TABLET | Freq: Four times a day (QID) | ORAL | 0 refills | Status: DC | PRN

## 2017-04-28 MED ORDER — SODIUM CHLORIDE 0.9 % IV BOLUS (SEPSIS)
1000.0000 mL | Freq: Once | INTRAVENOUS | Status: AC
  Administered 2017-04-28: 1000 mL via INTRAVENOUS

## 2017-04-28 NOTE — ED Notes (Signed)
Pt discharged to home with family. NAD.  

## 2017-04-28 NOTE — ED Triage Notes (Signed)
Pt presents with c/o abdominal pain n/v/d and feels like her throat is closing all for a few hours

## 2017-04-28 NOTE — Discharge Instructions (Signed)
Take loperamide (Imodium A-D) as needed for diarrhea. ?

## 2017-04-28 NOTE — ED Provider Notes (Addendum)
[facility] EMERGENCY DEPARTMENT Provider Note   CSN: [id number] Arrival date & time: 04/28/17  0136     History   Chief Complaint Chief Complaint  Patient presents with  . Abdominal Pain    HPI Kristie Price is a 22 y.o. female.  The history is provided by the patient.  She had onset 2 hours ago of periumbilical pain, nausea, vomiting, diarrhea.  She has vomited twice and had one episode of diarrhea.  She denies fever but has had chills.  There have been no sweats.  She has no known sick contacts and did not eat anything out of the ordinary.  There is been no treatment at home.  History reviewed. No pertinent past medical history.  There are no active problems to display for this patient.   Past Surgical History:  Procedure Laterality Date  . TONSILLECTOMY    . WISDOM TOOTH EXTRACTION      OB History    No data available       Home Medications    Prior to Admission medications   Medication Sig Start Date End Date Taking? Authorizing Provider  metroNIDAZOLE (FLAGYL) 500 MG tablet Take 1 tablet (500 mg total) by mouth 2 (two) times daily. One po bid x 7 days 12/01/16   Geoffery Lyonselo, Douglas, MD  ondansetron (ZOFRAN) 4 MG tablet Take 1 tablet (4 mg total) by mouth every 8 (eight) hours as needed for nausea or vomiting. 05/13/16   Georgiana ShoreMitchell, Jessica B, PA-C    Family History No family history on file.  Social History Social History   Tobacco Use  . Smoking status: Never Smoker  . Smokeless tobacco: Never Used  Substance Use Topics  . Alcohol use: No  . Drug use: No     Allergies   Patient has no known allergies.   Review of Systems Review of Systems  All other systems reviewed and are negative.    Physical Exam Updated Vital Signs BP 131/78 (BP Location: Left Arm)   Pulse 87   Temp 98.7 F (37.1 C) (Oral)   Resp 18   Ht 5\' 3"  (1.6 m)   Wt 102.1 kg (225 lb)   LMP 04/21/2017   SpO2 97%   BMI 39.86 kg/m   Physical Exam  Nursing note and  vitals reviewed.  22 year old female, resting comfortably and in no acute distress. Vital signs are normal. Oxygen saturation is 97%, which is normal. Head is normocephalic and atraumatic. PERRLA, EOMI. Oropharynx is clear. Neck is nontender and supple without adenopathy or JVD. Back is nontender and there is no CVA tenderness. Lungs are clear without rales, wheezes, or rhonchi. Chest is nontender. Heart has regular rate and rhythm without murmur. Abdomen is soft, flat, with mild periumbilical tenderness.  There is no rebound or guarding.  There are no masses or hepatosplenomegaly and peristalsis is slightly hypoactive. Extremities have no cyanosis or edema, full range mof motion is present. Skin is warm and dry without rash. Neurologic: Mental status is normal, cranial nerves are intact, there are no motor or sensory deficits.  ED Treatments / Results  Labs (all labs ordered are listed, but only abnormal results are displayed) Labs Reviewed  COMPREHENSIVE METABOLIC PANEL - Abnormal; Notable for the following components:      Result Value   Glucose, Bld 106 (*)    Calcium 8.5 (*)    All other components within normal limits  CBC WITH DIFFERENTIAL/PLATELET - Abnormal; Notable for the following components:  WBC 11.0 (*)    HCT 35.5 (*)    Neutro Abs 8.5 (*)    All other components within normal limits  URINALYSIS, ROUTINE W REFLEX MICROSCOPIC - Abnormal; Notable for the following components:   Hgb urine dipstick LARGE (*)    All other components within normal limits  URINALYSIS, MICROSCOPIC (REFLEX) - Abnormal; Notable for the following components:   Bacteria, UA FEW (*)    Squamous Epithelial / LPF 6-30 (*)    All other components within normal limits  LIPASE, BLOOD  PREGNANCY, URINE    Procedures Procedures (including critical care time)  Medications Ordered in ED Medications  sodium chloride 0.9 % bolus 1,000 mL (not administered)  ondansetron (ZOFRAN) injection 4 mg  (not administered)  loperamide (IMODIUM) capsule 4 mg (not administered)     Initial Impression / Assessment and Plan / ED Course  I have reviewed the triage vital signs and the nursing notes.  Pertinent labs & imaging results that were available during my care of the patient were reviewed by me and considered in my medical decision making (see chart for details).  Nausea, vomiting, diarrhea, abdominal pain and pattern most suggestive of viral gastroenteritis, possible food poisoning.  No red flags to suggest more serious illness.  Old records are reviewed, and she does have prior ED visits for abdominal pain, vomiting, diarrhea.  Will check screening labs and give IV fluids, ondansetron, loperamide.  She feels much better after above-noted treatment.  Laboratory workup is unremarkable.  She is discharged with prescription for ondansetron, advised to use over-the-counter loperamide as needed for diarrhea.  Return precautions discussed.  Final Clinical Impressions(s) / ED Diagnoses   Final diagnoses:  Nausea vomiting and diarrhea    ED Discharge Orders        Ordered    ondansetron (ZOFRAN) 4 MG tablet  Every 6 hours PRN     04/28/17 0426       Dione Booze, MD 04/28/17 2956    Dione Booze, MD 04/28/17 [phone]

## 2017-08-01 ENCOUNTER — Encounter (HOSPITAL_BASED_OUTPATIENT_CLINIC_OR_DEPARTMENT_OTHER): Payer: Self-pay | Admitting: *Deleted

## 2017-08-01 ENCOUNTER — Emergency Department (HOSPITAL_BASED_OUTPATIENT_CLINIC_OR_DEPARTMENT_OTHER)
Admission: EM | Admit: 2017-08-01 | Discharge: 2017-08-01 | Disposition: A | Discharge status: Home / Self Care | Payer: Self-pay | Attending: Emergency Medicine | Admitting: Emergency Medicine

## 2017-08-01 ENCOUNTER — Emergency Department (HOSPITAL_BASED_OUTPATIENT_CLINIC_OR_DEPARTMENT_OTHER): Payer: Self-pay

## 2017-08-01 ENCOUNTER — Other Ambulatory Visit: Payer: Self-pay

## 2017-08-01 DIAGNOSIS — R002 Palpitations: Secondary | ICD-10-CM | POA: Insufficient documentation

## 2017-08-01 LAB — PREGNANCY, URINE: PREG TEST UR: NEGATIVE

## 2017-08-01 LAB — CBC
HCT: 35.5 % — ABNORMAL LOW (ref 36.0–46.0)
Hemoglobin: 12.5 g/dL (ref 12.0–15.0)
MCH: 30.9 pg (ref 26.0–34.0)
MCHC: 35.2 g/dL (ref 30.0–36.0)
MCV: 87.7 fL (ref 78.0–100.0)
Platelets: 230 10*3/uL (ref 150–400)
RBC: 4.05 MIL/uL (ref 3.87–5.11)
RDW: 12.5 % (ref 11.5–15.5)
WBC: 11.6 10*3/uL — AB (ref 4.0–10.5)

## 2017-08-01 LAB — BASIC METABOLIC PANEL
Anion gap: 8 (ref 5–15)
BUN: 9 mg/dL (ref 6–20)
CALCIUM: 9.1 mg/dL (ref 8.9–10.3)
CHLORIDE: 107 mmol/L (ref 101–111)
CO2: 23 mmol/L (ref 22–32)
Creatinine, Ser: 0.84 mg/dL (ref 0.44–1.00)
GFR calc non Af Amer: >60 mL/min (ref >60)
Glucose, Bld: 85 mg/dL (ref 65–99)
Potassium: 3.8 mmol/L (ref 3.5–5.1)
SODIUM: 138 mmol/L (ref 135–145)

## 2017-08-01 LAB — D-DIMER, QUANTITATIVE (NOT AT ARMC)

## 2017-08-01 LAB — TROPONIN I: Troponin I: <0.03 ng/mL (ref <0.03)

## 2017-08-01 NOTE — ED Triage Notes (Signed)
PT reports heart fluttering that 'lasted a couple seconds' this morning upon waking up. Denies N/V/SOB. States that this is a recurrent issue over the last several months.

## 2017-08-01 NOTE — ED Provider Notes (Signed)
[facility] EMERGENCY DEPARTMENT Provider Note   CSN: [id number] Arrival date & time: 08/01/17  1452     History   Chief Complaint Chief Complaint  Patient presents with  . Tachycardia    HPI Kristie Price is a 22 y.o. female who presents for evaluation of palpitations that began this morning.  Patient reports that approximately 7 AM, she had palpitations that lasted for approximately 1 minute.  Patient reports she had some a source associated shortness of breath with this.  Patient reports that last night, she had one episode where she had pain in her chest.  Patient reports that she has had this intermittently for the last year.  She has not followed up with any primary care doctor or cardiologist for evaluation of the symptoms.  Patient reports she did drink some caffeine approximately 2 days ago.  She denies any other cocaine use or alcohol use.  Patient reports she is not a smoker.  Patient states that in ED arrival, the palpitations have resolved.  Patient denies any associated nausea, vomiting. She denies any OCP use, recent immobilization, prior history of DVT/PE, recent surgery, leg swelling, or long travel.  Patient does report that several members in her family have had blood clots.  She does not know any of family hypercoagulability disorders. Patient had ample opportunity for questions and discussion. All patient's questions were answered with full understanding. Strict return precautions discussed. Patient expresses understanding and agreement to plan.   The history is provided by the patient.    History reviewed. No pertinent past medical history.  There are no active problems to display for this patient.   Past Surgical History:  Procedure Laterality Date  . TONSILLECTOMY    . WISDOM TOOTH EXTRACTION       OB History   None      Home Medications    Prior to Admission medications   Medication Sig Start Date End Date Taking? Authorizing Provider    ondansetron (ZOFRAN) 4 MG tablet Take 1 tablet (4 mg total) by mouth every 6 (six) hours as needed for nausea or vomiting. 04/28/17  Yes Dione Booze, MD    Family History History reviewed. No pertinent family history.  Social History Social History   Tobacco Use  . Smoking status: Never Smoker  . Smokeless tobacco: Never Used  Substance Use Topics  . Alcohol use: No  . Drug use: No     Allergies   Patient has no known allergies.   Review of Systems Review of Systems  Constitutional: Negative for diaphoresis.  Respiratory: Positive for shortness of breath. Negative for cough.   Cardiovascular: Positive for palpitations. Negative for chest pain and leg swelling.  Gastrointestinal: Negative for abdominal pain, nausea and vomiting.  Genitourinary: Negative for dysuria and hematuria.  Neurological: Negative for headaches.  All other systems reviewed and are negative.    Physical Exam Updated Vital Signs BP 119/66 (BP Location: Right Arm)   Pulse 71   Temp 99.7 F (37.6 C) (Oral)   Resp 20   Ht 5\' 3"  (1.6 m)   Wt 99.8 kg (220 lb)   LMP 07/01/2017   SpO2 100%   BMI 38.97 kg/m   Physical Exam  Constitutional: She is oriented to person, place, and time. She appears well-developed and well-nourished.  HENT:  Head: Normocephalic and atraumatic.  Mouth/Throat: Oropharynx is clear and moist and mucous membranes are normal.  Eyes: Pupils are equal, round, and reactive to light. Conjunctivae, EOM and  lids are normal.  Neck: Full passive range of motion without pain.  Cardiovascular: Normal rate, regular rhythm, normal heart sounds and normal pulses. Exam reveals no gallop and no friction rub.  No murmur heard. Pulmonary/Chest: Effort normal and breath sounds normal.  No evidence of respiratory distress. Able to speak in full sentences without difficulty.  Abdominal: Soft. Normal appearance. There is no tenderness. There is no rigidity and no guarding.  Musculoskeletal:  Normal range of motion.  BLE are symmetric in appearance. No swelling.   Neurological: She is alert and oriented to person, place, and time.  Skin: Skin is warm and dry. Capillary refill takes less than 2 seconds.  Psychiatric: She has a normal mood and affect. Her speech is normal.  Nursing note and vitals reviewed.    ED Treatments / Results  Labs (all labs ordered are listed, but only abnormal results are displayed) Labs Reviewed  CBC - Abnormal; Notable for the following components:      Result Value   WBC 11.6 (*)    HCT 35.5 (*)    All other components within normal limits  BASIC METABOLIC PANEL  PREGNANCY, URINE  TROPONIN I  D-DIMER, QUANTITATIVE (NOT AT [facility])    EKG EKG Interpretation  Date/Time:  Sunday August 01 2017 15:10:32 EDT Ventricular Rate:  85 PR Interval:  160 QRS Duration: 90 QT Interval:  350 QTC Calculation: 416 R Axis:   85 Text Interpretation:  Normal sinus rhythm Nonspecific T wave abnormality Abnormal ECG Confirmed by Rees, Elizabeth ([id number]) on 08/01/2017 3:15:16 PM   Radiology Dg Chest 2 View  Result Date: 08/01/2017 CLINICAL DATA:  Palpitations. EXAM: CHEST - 2 VIEW COMPARISON:  None. FINDINGS: The heart size and mediastinal contours are within normal limits. Both lungs are clear. The visualized skeletal structures are unremarkable. IMPRESSION: Negative.  No active cardiopulmonary disease. Electronically Signed   By: John  Stahl M.D.   On: 08/01/2017 15:37    Procedures Procedures (including critical care time)  Medications Ordered in ED Medications - No data to display   Initial Impression / Assessment and Plan / ED Course  I have reviewed the triage vital signs and the nursing notes.  Pertinent labs & imaging results that were available during my care of the patient were reviewed by me and considered in my medical decision making (see chart for details).     22  year old female who presents for evaluation of palpitations that  occurred at 7 AM this morning.  Patient reports that she had some shortness of breath with the palpitations.  No chest pain.  On ED arrival, symptoms have resolved.  She states the symptoms only lasted a few seconds.  She states that this is been an ongoing issue for a year and she will intermittently have episodes of similar symptoms.  She has not gotten this evaluated.  Patient denies any chest pain, nausea/vomiting, diaphoresis.  She denies any cocaine, heroin, marijuana use.  She reports she did drink coffee 2 days ago but otherwise denies any excessive caffeine use.  Patient does report that she has several members of the family who have had blood clots in her legs or lungs.  She states she does not know if there is a hypercoagulability familial disorder.  Patient states she has not had any recent surgeries, immobilizations, history of blood clots.  She is not currently on any OCPs. Patient is afebrile, non-toxic appearing, sitting comfortably on examination table. Vital signs reviewed and stable.  On exam, patient is  not tachycardic.  No evidence of murmur.  Legs are symmetric in appearance.  Given family history of blood clots in patient's brief history of SAB, will plan for d-dimer.  We will also check EKG, troponin, chest x-ray, basic labs.  CBC shows mild leukocytosis of 11.6.  Hemoglobin stable at 12.5.  Troponin negative.  BMP is unremarkable.  Pregnancy is negative.  D-dimer is negative.  Chest x-ray shows no acute abnormality's.  EKG shows some nonspecific T wave abnormalities but no other sinus arrhythmia.  No evidence of sinus tachycardia.  Patient's vitals have been stable here in the ED with no evidence of tachycardia.  O2 sats are greater than 95% on room air.  I discussed results with patient.  We will plan to give outpatient referral to cardiology for patient to follow-up regarding her symptoms.  No acute intervention needed here in the ED tonight. Patient had ample opportunity for questions  and discussion. All patient's questions were answered with full understanding. Strict return precautions discussed. Patient expresses understanding and agreement to plan.   Final Clinical Impressions(s) / ED Diagnoses   Final diagnoses:  Palpitations    ED Discharge Orders    None       Rosana Hoes 08/01/17 2310    Tilden Fossa, MD 08/02/17 0127

## 2017-08-01 NOTE — Discharge Instructions (Signed)
Your workup today was very reassuring.  As we discussed, this may be something that needs evaluated by a cardiologist.  I have put in a referral for you.  Call their office and arrange an appointment.  As we discussed, limit the amount of caffeine, energy drinks to eat and drink until you are seen by the cardiologist.  Return to the emergency department for any worsening palpitations, chest pain, difficulty breathing, nausea/vomiting or any other worsening or concerning symptoms.

## 2017-08-01 NOTE — ED Notes (Signed)
Pt aware of need for urine  

## 2017-08-01 NOTE — ED Notes (Signed)
Pt given d/c instructions as per chart. Verbalizes understanding. No questions. 

## 2017-08-30 ENCOUNTER — Other Ambulatory Visit: Payer: Self-pay

## 2017-08-30 ENCOUNTER — Emergency Department (HOSPITAL_BASED_OUTPATIENT_CLINIC_OR_DEPARTMENT_OTHER)
Admission: EM | Admit: 2017-08-30 | Discharge: 2017-08-30 | Disposition: A | Discharge status: Home / Self Care | Payer: Self-pay | Attending: Emergency Medicine | Admitting: Emergency Medicine

## 2017-08-30 ENCOUNTER — Encounter (HOSPITAL_BASED_OUTPATIENT_CLINIC_OR_DEPARTMENT_OTHER): Payer: Self-pay | Admitting: *Deleted

## 2017-08-30 DIAGNOSIS — R202 Paresthesia of skin: Secondary | ICD-10-CM | POA: Insufficient documentation

## 2017-08-30 LAB — CBG MONITORING, ED: Glucose-Capillary: 70 mg/dL (ref 65–99)

## 2017-08-30 NOTE — ED Notes (Signed)
Pt verbalized understanding of discharge instructions and denies any further questions at this time.   

## 2017-08-30 NOTE — ED Triage Notes (Signed)
Pt reports numbness to her right hand x yesterday, also some bilateral instep of feet numbness, pain is 7/10. Full moe during triage, speech is clear, grips are = and strong.

## 2017-08-30 NOTE — ED Provider Notes (Signed)
[facility] EMERGENCY DEPARTMENT Provider Note   CSN: [id number] Arrival date & time: 08/30/17  0940     History   Chief Complaint Chief Complaint  Patient presents with  . Hand Pain    HPI Kristie Price is a 22 y.o. female.  HPI   22yF with intermittent paresthesias. Has felt numbness in medial aspect of both feet intermittently over the past two days. This morning felt numbness in the ulnar aspect of L hand and fingers. Currently no numbness in feet but persists in hand. No weakness. No HA, neck or back pain. She is concerned she may be a diabetic.   History reviewed. No pertinent past medical history.  There are no active problems to display for this patient.   Past Surgical History:  Procedure Laterality Date  . TONSILLECTOMY    . WISDOM TOOTH EXTRACTION       OB History   None      Home Medications    Prior to Admission medications   Medication Sig Start Date End Date Taking? Authorizing Provider  ondansetron (ZOFRAN) 4 MG tablet Take 1 tablet (4 mg total) by mouth every 6 (six) hours as needed for nausea or vomiting. 04/28/17   Dione Booze, MD    Family History History reviewed. No pertinent family history.  Social History Social History   Tobacco Use  . Smoking status: Never Smoker  . Smokeless tobacco: Never Used  Substance Use Topics  . Alcohol use: No  . Drug use: No     Allergies   Patient has no known allergies.   Review of Systems Review of Systems  All systems reviewed and negative, other than as noted in HPI.  Physical Exam Updated Vital Signs BP 132/68 (BP Location: Right Arm)   Pulse 75   Temp 98.6 F (37 C) (Oral)   Resp 16   Ht  (1.6 m)   Wt 98 kg (216 lb)   LMP 08/29/2017   SpO2 99%   BMI 38.26 kg/m   Physical Exam  Constitutional: She is oriented to person, place, and time. She appears well-developed and well-nourished. No distress.  HENT:  Head: Normocephalic and atraumatic.  Eyes:  Conjunctivae are normal. Right eye exhibits no discharge. Left eye exhibits no discharge.  Neck: Neck supple.  Cardiovascular: Normal rate, regular rhythm and normal heart sounds. Exam reveals no gallop and no friction rub.  No murmur heard. Pulmonary/Chest: Effort normal and breath sounds normal. No respiratory distress.  Abdominal: Soft. She exhibits no distension. There is no tenderness.  Musculoskeletal: She exhibits no edema or tenderness.  Neurological: She is alert and oriented to person, place, and time. No cranial nerve deficit. She exhibits normal muscle tone. Coordination normal.  Decreased sensation in ulnar nerve distribution L hand and pinky/ring fingers. Sensation intact to light touch otherwise. No motor deficits.   Skin: Skin is warm and dry.  Psychiatric: She has a normal mood and affect. Her behavior is normal. Thought content normal.  Nursing note and vitals reviewed.    ED Treatments / Results  Labs (all labs ordered are listed, but only abnormal results are displayed) Labs Reviewed  CBG MONITORING, ED    EKG None  Radiology No results found.  Procedures Procedures (including critical care time)  Medications Ordered in ED Medications - No data to display   Initial Impression / Assessment and Plan / ED Course  I have reviewed the triage vital signs and the nursing notes.  Pertinent labs &  imaging results that were available during my care of the patient were reviewed by me and considered in my medical decision making (see chart for details).  22yF with intermittent numbness in extremities over the past few days. 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 Motor function is fine. I'm not sure etiology of numbness in feet. Currently resolved. Neuro exam aside from L hand is normal. I doubt emergent process. I do not think this requires emergent work-up currently. Glucose normal. Return precautions discussed. Outpt FU otherwise.   Final  Clinical Impressions(s) / ED Diagnoses   Final diagnoses:  Paresthesia    ED Discharge Orders    None       Raeford Razor, MD 08/31/17 1104

## 2018-02-11 ENCOUNTER — Encounter (HOSPITAL_BASED_OUTPATIENT_CLINIC_OR_DEPARTMENT_OTHER): Payer: Self-pay

## 2018-02-11 ENCOUNTER — Other Ambulatory Visit: Payer: Self-pay

## 2018-02-11 ENCOUNTER — Emergency Department (HOSPITAL_BASED_OUTPATIENT_CLINIC_OR_DEPARTMENT_OTHER)
Admission: EM | Admit: 2018-02-11 | Discharge: 2018-02-11 | Disposition: A | Discharge status: Home / Self Care | Payer: Self-pay | Attending: Emergency Medicine | Admitting: Emergency Medicine

## 2018-02-11 DIAGNOSIS — J069 Acute upper respiratory infection, unspecified: Secondary | ICD-10-CM | POA: Insufficient documentation

## 2018-02-11 DIAGNOSIS — B9789 Other viral agents as the cause of diseases classified elsewhere: Secondary | ICD-10-CM

## 2018-02-11 MED ORDER — FLUTICASONE PROPIONATE 50 MCG/ACT NA SUSP: 1.0000 | Freq: Every day | NASAL | 2 refills | Status: AC

## 2018-02-11 MED ORDER — BENZONATATE 200 MG PO CAPS: 200.0000 mg | ORAL_CAPSULE | Freq: Three times a day (TID) | ORAL | 0 refills | Status: AC

## 2018-02-11 MED ORDER — CETIRIZINE-PSEUDOEPHEDRINE ER 5-120 MG PO TB12: 1.0000 | ORAL_TABLET | Freq: Every day | ORAL | 0 refills | Status: DC

## 2018-02-11 NOTE — ED Triage Notes (Signed)
C/o flu like sx 2-3 days-NAD-steady gait

## 2018-02-11 NOTE — ED Provider Notes (Signed)
[facility] EMERGENCY DEPARTMENT Provider Note   CSN: [id number] Arrival date & time: 02/11/18  1111     History   Chief Complaint Chief Complaint  Patient presents with  . Cough    HPI Kristie Price is a 22 y.o. female.  22 year old female presents with complaint of nonproductive cough and congestion x2 to 3 days.  Patient denies history of asthma, does not smoke, does not vape.  Patient reports taking homeopathic treatment as well as OTC Mucinex with minimal relief of her symptoms.  Denies fevers, chills, body aches.  No other complaints or concerns.     History reviewed. No pertinent past medical history.  There are no active problems to display for this patient.   Past Surgical History:  Procedure Laterality Date  . TONSILLECTOMY    . WISDOM TOOTH EXTRACTION       OB History   None      Home Medications    Prior to Admission medications   Medication Sig Start Date End Date Taking? Authorizing Provider  benzonatate (TESSALON) 200 MG capsule Take 1 capsule (200 mg total) by mouth every 8 (eight) hours for 10 days. 02/11/18 02/21/18  Jeannie Fend, PA-C  cetirizine-pseudoephedrine (ZYRTEC-D) 5-120 MG tablet Take 1 tablet by mouth daily. 02/11/18   Jeannie Fend, PA-C  fluticasone (FLONASE) 50 MCG/ACT nasal spray Place 1 spray into both nostrils daily. 02/11/18   Jeannie Fend, PA-C    Family History No family history on file.  Social History Social History   Tobacco Use  . Smoking status: Never Smoker  . Smokeless tobacco: Never Used  Substance Use Topics  . Alcohol use: No  . Drug use: No     Allergies   Mushroom extract complex   Review of Systems Review of Systems  Constitutional: Negative for chills, diaphoresis and fever.  HENT: Positive for congestion and postnasal drip. Negative for ear pain, rhinorrhea, sinus pressure, sinus pain, sneezing, sore throat, trouble swallowing and voice change.   Eyes: Negative for  discharge and redness.  Respiratory: Positive for cough. Negative for shortness of breath and wheezing.   Musculoskeletal: Negative for arthralgias and myalgias.  Skin: Negative for rash and wound.  Allergic/Immunologic: Negative for immunocompromised state.  Neurological: Negative for weakness and headaches.  Hematological: Negative for adenopathy.  All other systems reviewed and are negative.    Physical Exam Updated Vital Signs BP 122/73 (BP Location: Left Arm)   Pulse 88   Temp 98.4 F (36.9 C) (Oral)   Resp 16   Ht 5\' 3"  (1.6 m)   Wt 93 kg   LMP 01/28/2018   SpO2 98%   BMI 36.31 kg/m   Physical Exam  Constitutional: She is oriented to person, place, and time. She appears well-developed and well-nourished. No distress.  HENT:  Head: Normocephalic and atraumatic.  Right Ear: External ear normal.  Left Ear: External ear normal.  Nose: Mucosal edema present. Right sinus exhibits no maxillary sinus tenderness and no frontal sinus tenderness. Left sinus exhibits no maxillary sinus tenderness and no frontal sinus tenderness.  Mouth/Throat: Mucous membranes are normal. Posterior oropharyngeal erythema present. No oropharyngeal exudate, posterior oropharyngeal edema or tonsillar abscesses. Tonsils are 1+ on the right. Tonsils are 1+ on the left. No tonsillar exudate.  Eyes: Conjunctivae are normal.  Neck: Neck supple.  Cardiovascular: Normal rate, regular rhythm, normal heart sounds and intact distal pulses.  No murmur heard. Pulmonary/Chest: Effort normal and breath sounds normal. No respiratory distress.  Lymphadenopathy:    She has no cervical adenopathy.  Neurological: She is alert and oriented to person, place, and time.  Skin: Skin is warm and dry. No rash noted. She is not diaphoretic.  Psychiatric: She has a normal mood and affect. Her behavior is normal.  Nursing note and vitals reviewed.    ED Treatments / Results  Labs (all labs ordered are listed, but only  abnormal results are displayed) Labs Reviewed - No data to display  EKG None  Radiology No results found.  Procedures Procedures (including critical care time)  Medications Ordered in ED Medications - No data to display   Initial Impression / Assessment and Plan / ED Course  I have reviewed the triage vital signs and the nursing notes.  Pertinent labs & imaging results that were available during my care of the patient were reviewed by me and considered in my medical decision making (see chart for details).  Clinical Course as of Feb 12 1144  Fri Feb 11, 2018  1144 22yo healthy, well appearing female with URI symptoms x 2-3 days. Recommend supportive therapy, rx for zyrtec D, flonase, tessalon. Note given for school as she missed a test today due to not feeling well. Referral to PCP for recheck, return to ER for worsening or concerning symptoms.    [LM]    Clinical Course User Index [LM] Jeannie Fend, PA-C   Final Clinical Impressions(s) / ED Diagnoses   Final diagnoses:  Viral URI with cough    ED Discharge Orders         Ordered    fluticasone (FLONASE) 50 MCG/ACT nasal spray  Daily     02/11/18 1139    cetirizine-pseudoephedrine (ZYRTEC-D) 5-120 MG tablet  Daily     02/11/18 1139    benzonatate (TESSALON) 200 MG capsule  Every 8 hours     02/11/18 1139           Jeannie Fend, PA-C 02/11/18 1145    Long, Arlyss Repress, MD 02/11/18 1954

## 2018-02-11 NOTE — Discharge Instructions (Signed)
In sinus rinse twice daily followed by Flonase twice daily.  Continue with the Flonase twice daily for 5 days.  You may then continue with Flonase daily if needed. Take Zyrtec D as prescribed. Take Tessalon as needed as prescribed for cough. Coolmist vaporizer to room at night. All up with primary care provider, referral given if needed.  Return to ER for severe or concerning symptoms.

## 2018-05-20 ENCOUNTER — Other Ambulatory Visit: Payer: Self-pay

## 2018-05-20 ENCOUNTER — Emergency Department (HOSPITAL_BASED_OUTPATIENT_CLINIC_OR_DEPARTMENT_OTHER)
Admission: EM | Admit: 2018-05-20 | Discharge: 2018-05-20 | Disposition: A | Discharge status: Home / Self Care | Payer: BLUE CROSS/BLUE SHIELD | Attending: Emergency Medicine | Admitting: Emergency Medicine

## 2018-05-20 ENCOUNTER — Emergency Department (HOSPITAL_BASED_OUTPATIENT_CLINIC_OR_DEPARTMENT_OTHER): Payer: BLUE CROSS/BLUE SHIELD

## 2018-05-20 ENCOUNTER — Encounter (HOSPITAL_BASED_OUTPATIENT_CLINIC_OR_DEPARTMENT_OTHER): Payer: Self-pay | Admitting: Emergency Medicine

## 2018-05-20 DIAGNOSIS — Y990 Civilian activity done for income or pay: Secondary | ICD-10-CM | POA: Insufficient documentation

## 2018-05-20 DIAGNOSIS — Y929 Unspecified place or not applicable: Secondary | ICD-10-CM | POA: Insufficient documentation

## 2018-05-20 DIAGNOSIS — S83422A Sprain of lateral collateral ligament of left knee, initial encounter: Secondary | ICD-10-CM | POA: Diagnosis not present

## 2018-05-20 DIAGNOSIS — S8992XA Unspecified injury of left lower leg, initial encounter: Secondary | ICD-10-CM | POA: Diagnosis present

## 2018-05-20 DIAGNOSIS — X500XXA Overexertion from strenuous movement or load, initial encounter: Secondary | ICD-10-CM | POA: Insufficient documentation

## 2018-05-20 DIAGNOSIS — Z79899 Other long term (current) drug therapy: Secondary | ICD-10-CM | POA: Diagnosis not present

## 2018-05-20 DIAGNOSIS — Y9389 Activity, other specified: Secondary | ICD-10-CM | POA: Diagnosis not present

## 2018-05-20 MED ORDER — IBUPROFEN 400 MG PO TABS
600.0000 mg | ORAL_TABLET | Freq: Once | ORAL | Status: AC
  Administered 2018-05-20: 600 mg via ORAL
  Filled 2018-05-20: qty 1

## 2018-05-20 MED ORDER — IBUPROFEN 600 MG PO TABS: 600.0000 mg | ORAL_TABLET | Freq: Three times a day (TID) | ORAL | 0 refills | Status: DC | PRN

## 2018-05-20 NOTE — Discharge Instructions (Signed)
For your knee:  - Keep the knee wrap on and elevate the knee for the next 24-48 hours - Use ice for 24 hours as needed, then warmth - Follow-up with an ORTHOPEDIST in 1 week - I suspect you may have a chronic sprain/ligament injury that may need further treatment - No heavy lifting or bending until cleared by orthopedics

## 2018-05-20 NOTE — ED Notes (Signed)
Patient transported to X-ray 

## 2018-05-20 NOTE — ED Triage Notes (Signed)
L knee pain since yesterday. 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 States she felt a pop and it "gave out".

## 2018-05-20 NOTE — ED Provider Notes (Signed)
[facility] EMERGENCY DEPARTMENT Provider Note   CSN: [id number] Arrival date & time: 05/20/18  0915     History   Chief Complaint Chief Complaint  Patient presents with  . Knee Pain    HPI Kristie Price is a 23 y.o. female.  HPI 23 year old female with no significant past medical history here with left knee pain.  The patient states that she lifted multiple heavy boxes at work yesterday.  She denied any pain at the time.  She noticed some soreness in her left knee which she describes as an aching, throbbing pain along her lateral knee, while she was walking around campus yesterday.  She went to bed, and woke up with left knee swelling and pain.  She is had pain with weightbearing since then.  Denies any direct trauma to the area.  She feels like it there is an occasional clicking noise in her knee when she walks.  Of note, she now recalls a history of previous knee injury when she was young, and states the left knee is caused issues since then.  No distal numbness or weakness.  No fevers.  No joint swelling.  No other complaints.  History reviewed. No pertinent past medical history.  There are no active problems to display for this patient.   Past Surgical History:  Procedure Laterality Date  . TONSILLECTOMY    . WISDOM TOOTH EXTRACTION       OB History   No obstetric history on file.      Home Medications    Prior to Admission medications   Medication Sig Start Date End Date Taking? Authorizing Provider  cetirizine-pseudoephedrine (ZYRTEC-D) 5-120 MG tablet Take 1 tablet by mouth daily. 02/11/18   Jeannie Fend, PA-C  fluticasone (FLONASE) 50 MCG/ACT nasal spray Place 1 spray into both nostrils daily. 02/11/18   Jeannie Fend, PA-C  ibuprofen (ADVIL,MOTRIN) 600 MG tablet Take 1 tablet (600 mg total) by mouth every 8 (eight) hours as needed for moderate pain. 05/20/18   Shaune Pollack, MD    Family History No family history on file.  Social  History Social History   Tobacco Use  . Smoking status: Never Smoker  . Smokeless tobacco: Never Used  Substance Use Topics  . Alcohol use: No  . Drug use: No     Allergies   Mushroom extract complex   Review of Systems Review of Systems  Constitutional: Negative for chills and fever.  Respiratory: Negative for shortness of breath.   Cardiovascular: Negative for chest pain.  Musculoskeletal: Positive for arthralgias and joint swelling. Negative for neck pain.  Skin: Negative for rash and wound.  Allergic/Immunologic: Negative for immunocompromised state.  Neurological: Negative for weakness and numbness.  Hematological: Does not bruise/bleed easily.     Physical Exam Updated Vital Signs BP 126/60 (BP Location: Right Arm)   Pulse 87   Temp 98.7 F (37.1 C) (Oral)   Resp 18   Ht 5\' 3"  (1.6 m)   Wt 95.3 kg   LMP 05/11/2018   SpO2 99%   BMI 37.20 kg/m   Physical Exam Vitals signs and nursing note reviewed.  Constitutional:      General: She is not in acute distress.    Appearance: She is well-developed.  HENT:     Head: Normocephalic and atraumatic.  Eyes:     Conjunctiva/sclera: Conjunctivae normal.  Neck:     Musculoskeletal: Neck supple.  Cardiovascular:     Rate and Rhythm: Normal rate and  regular rhythm.     Heart sounds: Normal heart sounds.  Pulmonary:     Effort: Pulmonary effort is normal. No respiratory distress.     Breath sounds: No wheezing.  Abdominal:     General: There is no distension.  Skin:    General: Skin is warm.     Capillary Refill: Capillary refill takes less than 2 seconds.     Findings: No rash.  Neurological:     Mental Status: She is alert and oriented to person, place, and time.     Motor: No abnormal muscle tone.     LOWER EXTREMITY EXAM: LEFT  INSPECTION & PALPATION: Moderate tenderness over the left lateral aspect of the knee, with small palpable joint effusion.  No erythema or warmth.  No appreciable clicking  with movement of the knee.  No ligamentous laxity, though there is significant tenderness upon stressing of the lateral collateral ligament.  Negative anterior and posterior drawer sign.  SENSORY: sensation is intact to light touch in:  Superficial peroneal nerve distribution (over dorsum of foot) Deep peroneal nerve distribution (over first dorsal web space) Sural nerve distribution (over lateral aspect 5th metatarsal) Saphenous nerve distribution (over medial instep)  MOTOR:  + Motor EHL (great toe dorsiflexion) + FHL (great toe plantar flexion)  + TA (ankle dorsiflexion)  + GSC (ankle plantar flexion)  VASCULAR: 2+ dorsalis pedis and posterior tibialis pulses Capillary refill < 2 sec, toes warm and well-perfused  COMPARTMENTS: Soft, warm, well-perfused No pain with passive extension No parethesias    ED Treatments / Results  Labs (all labs ordered are listed, but only abnormal results are displayed) Labs Reviewed - No data to display  EKG None  Radiology Dg Knee Complete 4 Views Left  Result Date: 05/20/2018 CLINICAL DATA:  Acute left knee pain after lifting boxes yesterday. EXAM: LEFT KNEE - COMPLETE 4+ VIEW COMPARISON:  None. FINDINGS: No evidence of fracture, dislocation, or joint effusion. No evidence of arthropathy or other focal bone abnormality. Soft tissues are unremarkable. IMPRESSION: Negative. Electronically Signed   By: Lupita RaiderJames  Green Jr, M.D.   On: 05/20/2018 10:12    Procedures Procedures (including critical care time)  Medications Ordered in ED Medications  ibuprofen (ADVIL,MOTRIN) tablet 600 mg (has no administration in time range)     Initial Impression / Assessment and Plan / ED Course  I have reviewed the triage vital signs and the nursing notes.  Pertinent labs & imaging results that were available during my care of the patient were reviewed by me and considered in my medical decision making (see chart for details).     23 year old female  here with atraumatic left knee pain.  Patient has a history of previous injury to this knee and I suspect she may have a component of possible meniscal injury versus chronic LCL injury.  No evidence of knee instability on my exam.  No palpable clicking.  Plain films are negative.  No joint warmth or evidence suggest infectious etiology.  Her time course of initially noted to mild pain and worsening pain overnight is consistent with possible meniscal or intra-articular etiology.  Will place in knee sleeve, nonweightbearing for 1 to 2 days as needed then slow return to normal weightbearing.  Will refer her to orthopedist.  Final Clinical Impressions(s) / ED Diagnoses   Final diagnoses:  Sprain of lateral collateral ligament of left knee, initial encounter    ED Discharge Orders         Ordered  ibuprofen (ADVIL,MOTRIN) 600 MG tablet  Every 8 hours PRN     05/20/18 1035           Shaune Pollack, MD 05/20/18 1036

## 2018-05-26 ENCOUNTER — Encounter: Payer: Self-pay | Admitting: Family Medicine

## 2018-05-26 ENCOUNTER — Ambulatory Visit: Payer: BLUE CROSS/BLUE SHIELD | Admitting: Family Medicine

## 2018-05-26 VITALS — BP 113/84 | HR 89 | Ht 63.0 in | Wt 210.0 lb

## 2018-05-26 DIAGNOSIS — M25562 Pain in left knee: Secondary | ICD-10-CM | POA: Diagnosis not present

## 2018-05-26 MED ORDER — DICLOFENAC SODIUM 75 MG PO TBEC: 75.0000 mg | DELAYED_RELEASE_TABLET | Freq: Two times a day (BID) | ORAL | 1 refills | Status: AC

## 2018-05-26 NOTE — Patient Instructions (Addendum)
You have a synovitis of your knee. Ice the knee 15 minutes at a time 3-4 times a day at least. Elevate above your heart as needed. Diclofenac 75mg  twice a day with food for pain and inflammation - don't take ibuprofen or aleve with this. Compression sleeve or ace wrap to help with support and swelling. Do basic motion exercises of your knee to regain motion. Come off crutches when you're able to. Follow up with me in 2 weeks for reevaluation.

## 2018-05-27 ENCOUNTER — Encounter: Payer: Self-pay | Admitting: Family Medicine

## 2018-05-27 NOTE — Progress Notes (Signed)
PCP: Patient, No Pcp Per  Subjective:   HPI: Patient is a 23 y.o. female here for left knee injury.  Patient reports on 1/31 she was lifting boxes at work but did not have acute injury with this. Some mild soreness of her knee after this, went to bed and woke up with swelling and a lot of pain anterior left knee. Pain level still at 8/10 level and sharp. Tried sleeve, ibuprofen, icing, crutches. Previously had issues with kneecap popping out of place most recently last year - remotely did PT and wore a brace. No skin changes, numbness.  History reviewed. No pertinent past medical history.  Current Outpatient Medications on File Prior to Visit  Medication Sig Dispense Refill  . fluticasone (FLONASE) 50 MCG/ACT nasal spray Place 1 spray into both nostrils daily. 16 g 2   No current facility-administered medications on file prior to visit.     Past Surgical History:  Procedure Laterality Date  . TONSILLECTOMY    . WISDOM TOOTH EXTRACTION      Allergies  Allergen Reactions  . Mushroom Extract Complex     Social History   Socioeconomic History  . Marital status: Single    Spouse name: Not on file  . Number of children: Not on file  . Years of education: Not on file  . Highest education level: Not on file  Occupational History  . Not on file  Social Needs  . Financial resource strain: Not on file  . Food insecurity:    Worry: Not on file    Inability: Not on file  . Transportation needs:    Medical: Not on file    Non-medical: Not on file  Tobacco Use  . Smoking status: Never Smoker  . Smokeless tobacco: Never Used  Substance and Sexual Activity  . Alcohol use: No  . Drug use: No  . Sexual activity: Not on file  Lifestyle  . Physical activity:    Days per week: Not on file    Minutes per session: Not on file  . Stress: Not on file  Relationships  . Social connections:    Talks on phone: Not on file    Gets together: Not on file    Attends religious  service: Not on file    Active member of club or organization: Not on file    Attends meetings of clubs or organizations: Not on file    Relationship status: Not on file  . Intimate partner violence:    Fear of current or ex partner: Not on file    Emotionally abused: Not on file    Physically abused: Not on file    Forced sexual activity: Not on file  Other Topics Concern  . Not on file  Social History Narrative  . Not on file    History reviewed. No pertinent family history.  BP 113/84   Pulse 89   Ht 5\' 3"  (1.6 m)   Wt 210 lb (95.3 kg)   LMP 05/11/2018   BMI 37.20 kg/m   Review of Systems: See HPI above.     Objective:  Physical Exam:  Gen: NAD, comfortable in exam room  Left knee: Mild effusion.  No other gross deformity, ecchymoses. TTP diffusely anterior knee including post patellar facets, joint lines. FROM with 5/5 strength flexion and extension. Negative ant/post drawers. Negative valgus/varus testing. Negative lachmans. Negative mcmurrays, apleys, patellar apprehension. NV intact distally.  Right knee: No deformity. FROM with 5/5 strength. No tenderness to  palpation. NVI distally.   Assessment & Plan:  1. Left knee pain - independently reviewed radiographs and no bony abnormalities.  Mild effusion, benign exam otherwise consistent with acute synovitis of knee.  Icing, elevation.  Start diclofenac.  Compression sleeve.  F/u in 2 weeks.

## 2018-06-09 ENCOUNTER — Encounter: Payer: Self-pay | Admitting: Family Medicine

## 2018-06-09 ENCOUNTER — Ambulatory Visit: Payer: BLUE CROSS/BLUE SHIELD | Admitting: Family Medicine

## 2018-06-09 VITALS — BP 132/68 | HR 90 | Ht 63.0 in | Wt 208.0 lb

## 2018-06-09 DIAGNOSIS — M25562 Pain in left knee: Secondary | ICD-10-CM

## 2018-06-09 NOTE — Progress Notes (Addendum)
PCP: Patient, No Pcp Per  Subjective:   HPI: Patient is a 23 y.o. female here for left knee injury.  2/6: Patient reports on 1/31 she was lifting boxes at work but did not have acute injury with this. Some mild soreness of her knee after this, went to bed and woke up with swelling and a lot of pain anterior left knee. Pain level still at 8/10 level and sharp. Tried sleeve, ibuprofen, icing, crutches. Previously had issues with kneecap popping out of place most recently last year - remotely did PT and wore a brace. No skin changes, numbness.  2/20: Patient reports she's improved compared to last visit. Still using crutches, pain is sharp at times and throbbing, currently 0/10 at rest. Swelling worse by end of day and goes down into foot. Diclofenac making her sleepy so using this more in evenings. Wearing sleeve, icing. Knee feels wobbly. No skin changes.  History reviewed. No pertinent past medical history.  Current Outpatient Medications on File Prior to Visit  Medication Sig Dispense Refill  . diclofenac (VOLTAREN) 75 MG EC tablet Take 1 tablet (75 mg total) by mouth 2 (two) times daily. 60 tablet 1  . fluticasone (FLONASE) 50 MCG/ACT nasal spray Place 1 spray into both nostrils daily. 16 g 2   No current facility-administered medications on file prior to visit.     Past Surgical History:  Procedure Laterality Date  . TONSILLECTOMY    . WISDOM TOOTH EXTRACTION      Allergies  Allergen Reactions  . Mushroom Extract Complex     Social History   Socioeconomic History  . Marital status: Single    Spouse name: Not on file  . Number of children: Not on file  . Years of education: Not on file  . Highest education level: Not on file  Occupational History  . Not on file  Social Needs  . Financial resource strain: Not on file  . Food insecurity:    Worry: Not on file    Inability: Not on file  . Transportation needs:    Medical: Not on file    Non-medical: Not on  file  Tobacco Use  . Smoking status: Never Smoker  . Smokeless tobacco: Never Used  Substance and Sexual Activity  . Alcohol use: No  . Drug use: No  . Sexual activity: Not on file  Lifestyle  . Physical activity:    Days per week: Not on file    Minutes per session: Not on file  . Stress: Not on file  Relationships  . Social connections:    Talks on phone: Not on file    Gets together: Not on file    Attends religious service: Not on file    Active member of club or organization: Not on file    Attends meetings of clubs or organizations: Not on file    Relationship status: Not on file  . Intimate partner violence:    Fear of current or ex partner: Not on file    Emotionally abused: Not on file    Physically abused: Not on file    Forced sexual activity: Not on file  Other Topics Concern  . Not on file  Social History Narrative  . Not on file    History reviewed. No pertinent family history.  BP 132/68   Pulse 90   Ht 5\' 3"  (1.6 m)   Wt 208 lb (94.3 kg)   LMP 05/11/2018   BMI 36.85 kg/m  Review of Systems: See HPI above.     Objective:  Physical Exam:  Gen: NAD, comfortable in exam room  Left knee: No gross deformity, ecchymoses, swelling. TTP medial and lateral joint lines, post patellar facets. FROM with 5/5 strength flexion and extension. Negative ant/post drawers. Negative valgus/varus testing. Negative lachmans. Negative mcmurrays, apleys, patellar apprehension. NV intact distally.  Assessment & Plan:  1. Left knee pain - radiographs negative.  Clinically improving. Icing, aleve in morning with diclofenac in evening.  Compression sleeve.  Wean off crutches, strengthening and motion exercises.  F/u in 4 weeks.  Consider MRI if not improving as expected.  Addendum:  MRI reviewed and discussed with patient.  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  Suspect one of  the injuries she had remotely was not a patellar dislocation but ACL tear with meniscus tear.  Repetitive squatting, activity at work caused knee to swell and flare up.  Advised she will need operative intervention for this - she's going to check with insurance and call us back about preferred location/MD.

## 2018-06-09 NOTE — Patient Instructions (Signed)
You have a synovitis of your knee. Ice the knee 15 minutes at a time 3-4 times a day at least. Elevate above your heart as needed. Aleve 2 tabs in the morning with diclofenac in the evening - take each with food. Compression sleeve or ace wrap to help with support and swelling. Do basic motion exercises of your knee to regain motion. Come off crutches when you're able to. Follow up with me in 4 weeks for reevaluation. We will consider an MRI if you're not improving as expected.

## 2018-07-07 ENCOUNTER — Encounter: Payer: Self-pay | Admitting: Family Medicine

## 2018-07-08 ENCOUNTER — Ambulatory Visit: Payer: Self-pay | Admitting: Family Medicine

## 2019-05-12 ENCOUNTER — Other Ambulatory Visit: Payer: Self-pay

## 2019-05-12 DIAGNOSIS — Z20822 Contact with and (suspected) exposure to covid-19: Secondary | ICD-10-CM

## 2019-05-13 LAB — NOVEL CORONAVIRUS, NAA: SARS-CoV-2, NAA: NOT DETECTED

## 2019-05-24 IMAGING — DX DG KNEE COMPLETE 4+V*L*
4 series · 4 of 4 positions shown · non-contrast
Comparison: None.

CLINICAL DATA: Acute left knee pain after lifting boxes yesterday.

EXAM:
LEFT KNEE - COMPLETE 4+ VIEW

[knee ap]
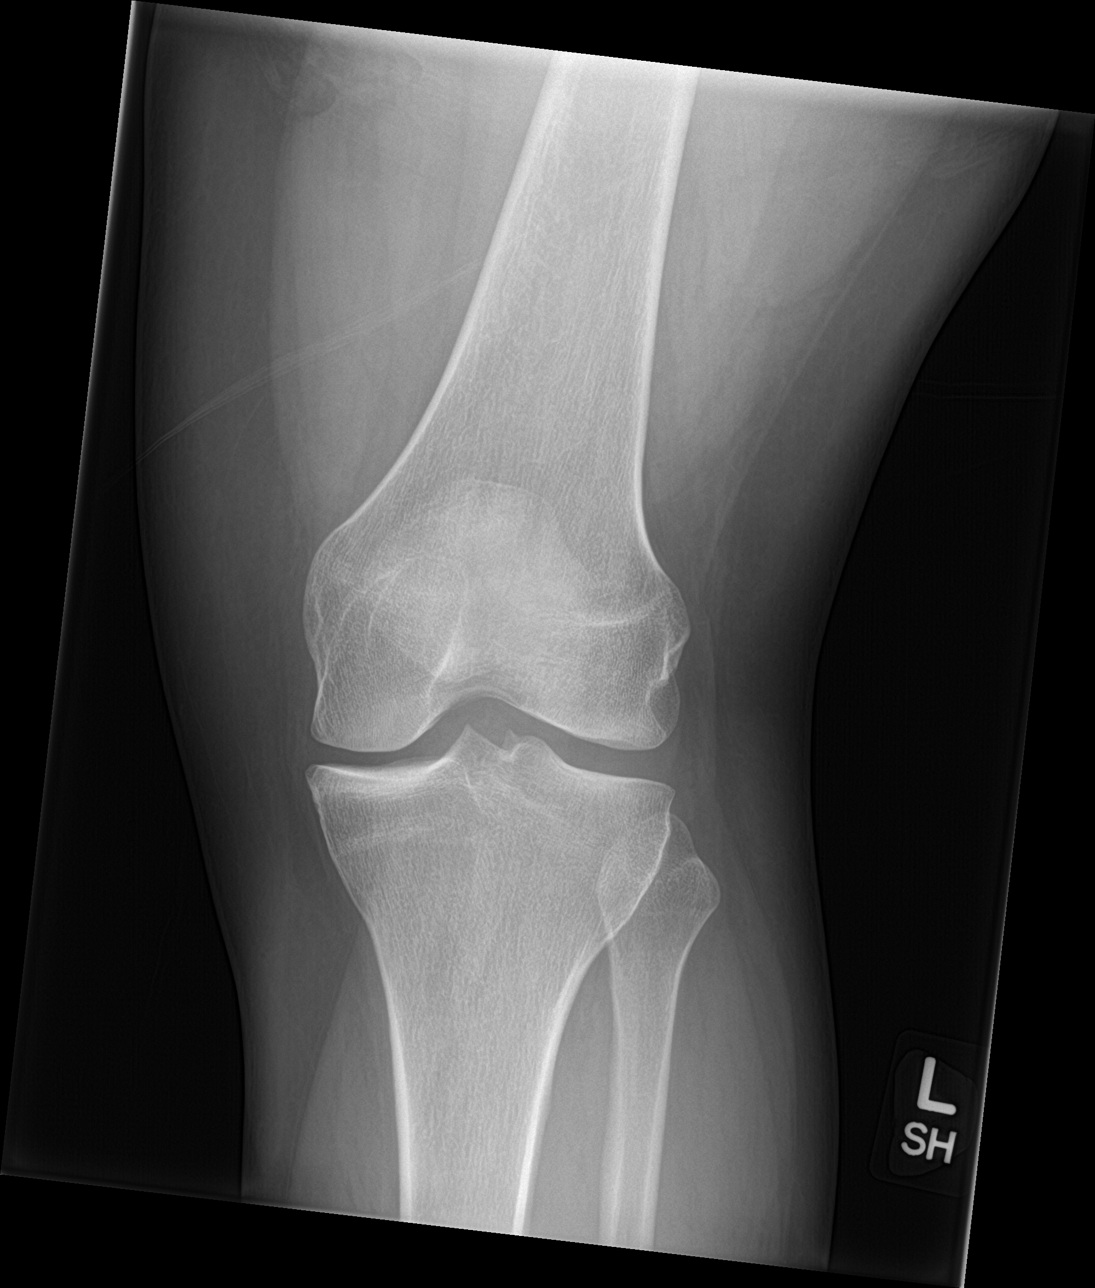

[knee lat]
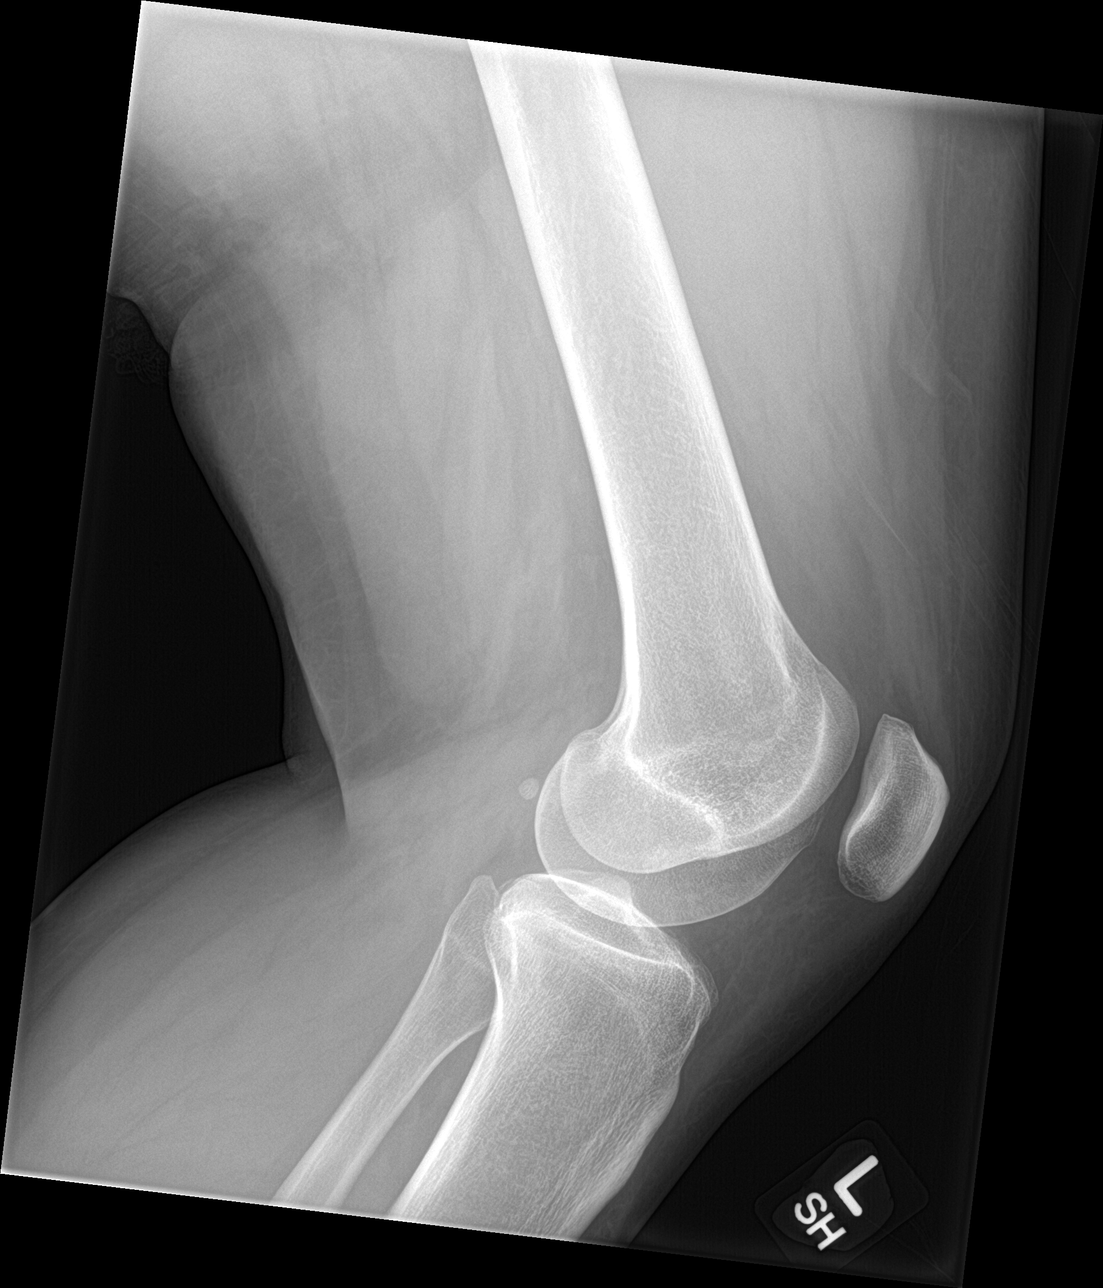

[knee obl (1 of 2)]
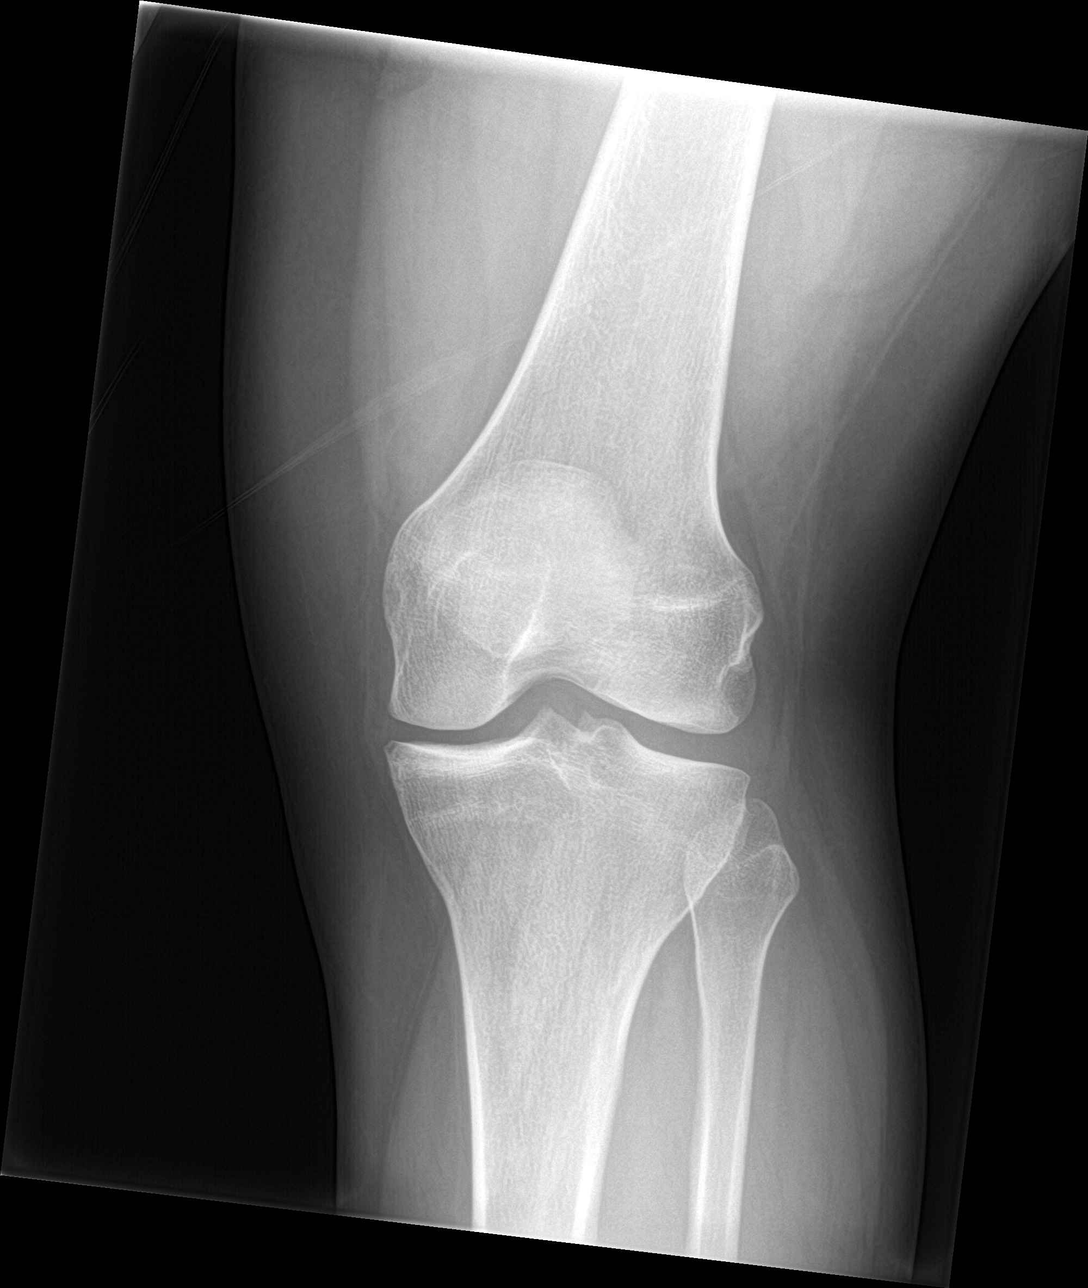

[knee obl (2 of 2)]
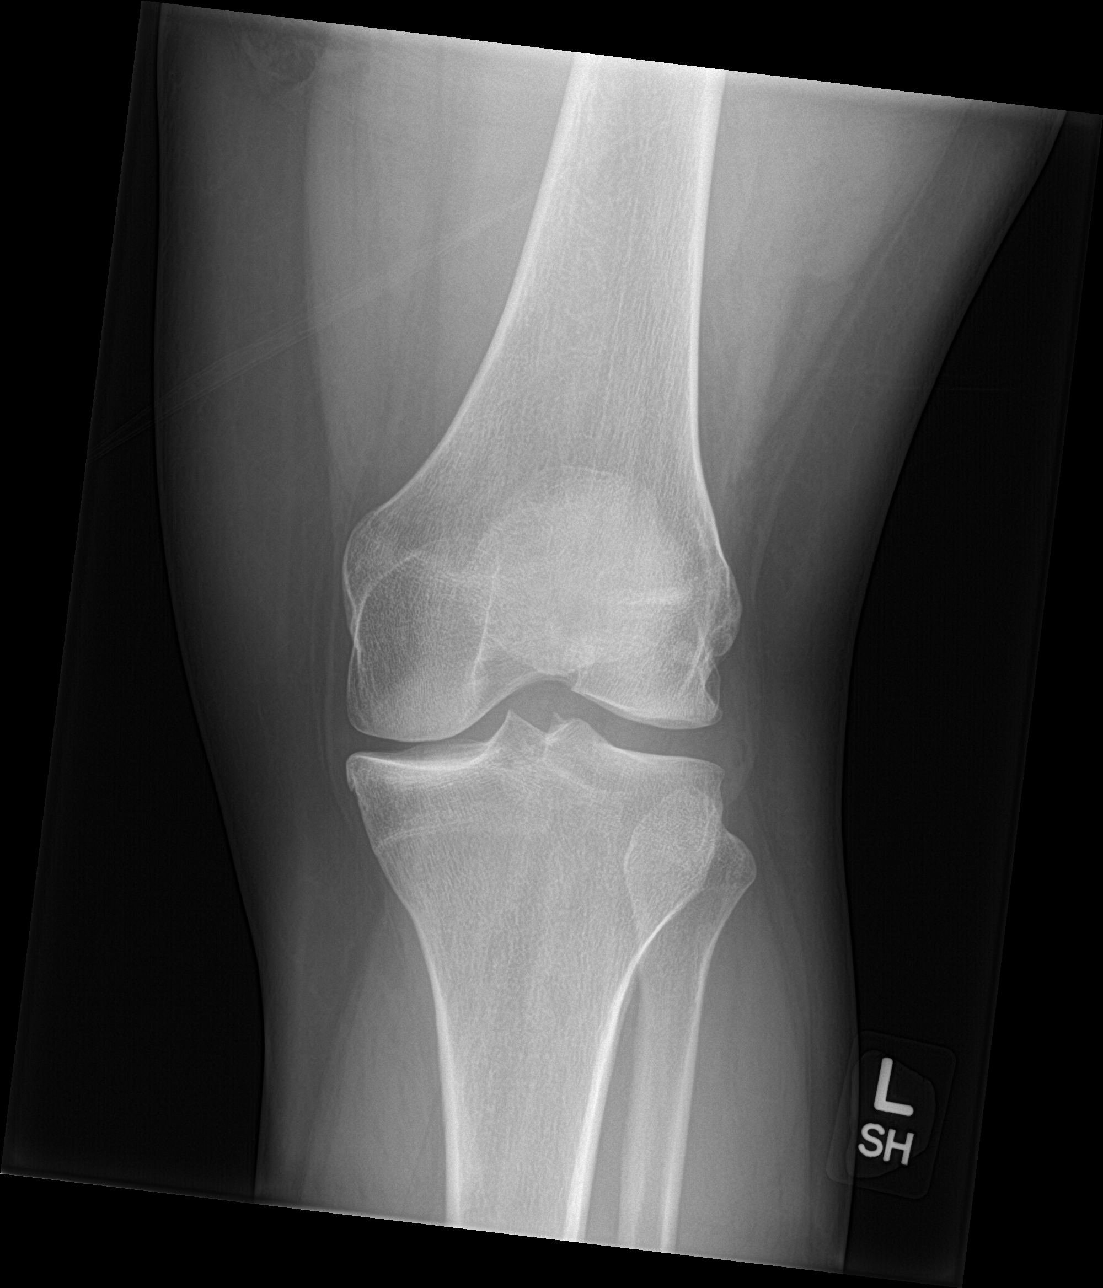

[4 of 4 positions shown; findings below may reference images not displayed]

FINDINGS: No evidence of fracture, dislocation, or joint effusion. No evidence
of arthropathy or other focal bone abnormality. Soft tissues are
unremarkable.
IMPRESSION: Negative.

## 2022-05-20 ENCOUNTER — Emergency Department (HOSPITAL_BASED_OUTPATIENT_CLINIC_OR_DEPARTMENT_OTHER)
Admission: EM | Admit: 2022-05-20 | Discharge: 2022-05-20 | Disposition: A | Discharge status: Home / Self Care | Payer: No Typology Code available for payment source | Attending: Emergency Medicine | Admitting: Emergency Medicine

## 2022-05-20 ENCOUNTER — Encounter (HOSPITAL_BASED_OUTPATIENT_CLINIC_OR_DEPARTMENT_OTHER): Payer: Self-pay | Admitting: Emergency Medicine

## 2022-05-20 ENCOUNTER — Emergency Department (HOSPITAL_BASED_OUTPATIENT_CLINIC_OR_DEPARTMENT_OTHER): Payer: No Typology Code available for payment source

## 2022-05-20 DIAGNOSIS — M542 Cervicalgia: Secondary | ICD-10-CM | POA: Diagnosis not present

## 2022-05-20 DIAGNOSIS — M25561 Pain in right knee: Secondary | ICD-10-CM | POA: Diagnosis present

## 2022-05-20 DIAGNOSIS — Y92410 Unspecified street and highway as the place of occurrence of the external cause: Secondary | ICD-10-CM | POA: Diagnosis not present

## 2022-05-20 DIAGNOSIS — R519 Headache, unspecified: Secondary | ICD-10-CM | POA: Insufficient documentation

## 2022-05-20 MED ORDER — ACETAMINOPHEN 325 MG PO TABS
650.0000 mg | ORAL_TABLET | Freq: Once | ORAL | Status: AC
  Administered 2022-05-20: 650 mg via ORAL
  Filled 2022-05-20: qty 2
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
## 2022-05-20 NOTE — ED Notes (Addendum)
Urine specimen left with Lab.
